# Patient Record
Sex: Female | Born: 1965 | Race: White | Hispanic: No | Marital: Married | Smoking: Never smoker
Health system: Southern US, Community
[De-identification: ages and names within clinical notes are randomized; demographics above are authoritative.]

## PROBLEM LIST (undated history)

## (undated) DIAGNOSIS — J45909 Unspecified asthma, uncomplicated: Secondary | ICD-10-CM

## (undated) DIAGNOSIS — I4891 Unspecified atrial fibrillation: Secondary | ICD-10-CM

## (undated) DIAGNOSIS — E119 Type 2 diabetes mellitus without complications: Secondary | ICD-10-CM

## (undated) DIAGNOSIS — I509 Heart failure, unspecified: Secondary | ICD-10-CM

## (undated) HISTORY — DX: Unspecified atrial fibrillation: I48.91

## (undated) HISTORY — PX: NASAL SINUS SURGERY: SHX719

## (undated) HISTORY — DX: Heart failure, unspecified: I50.9

## (undated) HISTORY — PX: TONSILLECTOMY: SUR1361

---

## 2021-08-12 ENCOUNTER — Other Ambulatory Visit: Payer: Self-pay

## 2021-08-12 DIAGNOSIS — I5023 Acute on chronic systolic (congestive) heart failure: Secondary | ICD-10-CM | POA: Diagnosis present

## 2021-08-12 DIAGNOSIS — Z6841 Body Mass Index (BMI) 40.0 and over, adult: Secondary | ICD-10-CM

## 2021-08-12 DIAGNOSIS — Z9119 Patient's noncompliance with other medical treatment and regimen: Secondary | ICD-10-CM

## 2021-08-12 DIAGNOSIS — Z20822 Contact with and (suspected) exposure to covid-19: Secondary | ICD-10-CM | POA: Diagnosis present

## 2021-08-12 DIAGNOSIS — Z79899 Other long term (current) drug therapy: Secondary | ICD-10-CM

## 2021-08-12 DIAGNOSIS — J4520 Mild intermittent asthma, uncomplicated: Secondary | ICD-10-CM | POA: Diagnosis present

## 2021-08-12 DIAGNOSIS — E785 Hyperlipidemia, unspecified: Secondary | ICD-10-CM | POA: Diagnosis present

## 2021-08-12 DIAGNOSIS — I252 Old myocardial infarction: Secondary | ICD-10-CM

## 2021-08-12 DIAGNOSIS — I4891 Unspecified atrial fibrillation: Secondary | ICD-10-CM | POA: Diagnosis present

## 2021-08-12 DIAGNOSIS — I248 Other forms of acute ischemic heart disease: Secondary | ICD-10-CM | POA: Diagnosis present

## 2021-08-12 DIAGNOSIS — E1165 Type 2 diabetes mellitus with hyperglycemia: Secondary | ICD-10-CM | POA: Diagnosis present

## 2021-08-12 DIAGNOSIS — I110 Hypertensive heart disease with heart failure: Principal | ICD-10-CM | POA: Diagnosis present

## 2021-08-12 DIAGNOSIS — Z5329 Procedure and treatment not carried out because of patient's decision for other reasons: Secondary | ICD-10-CM | POA: Diagnosis not present

## 2021-08-12 DIAGNOSIS — Z8249 Family history of ischemic heart disease and other diseases of the circulatory system: Secondary | ICD-10-CM

## 2021-08-12 DIAGNOSIS — J329 Chronic sinusitis, unspecified: Secondary | ICD-10-CM | POA: Diagnosis present

## 2021-08-12 NOTE — ED Triage Notes (Signed)
Pt arrived via POV with reports of approx 50lb weight gain over the past 3 weeks, as well as exertional shortness of breath. Pt states she normally has swelling to L leg, pt states she has been treated recently for recurrent sinusitis. Pt has been on doxycycline and clarithromycin.

## 2021-08-13 ENCOUNTER — Inpatient Hospital Stay: Payer: Medicaid Other

## 2021-08-13 ENCOUNTER — Encounter: Payer: Self-pay | Admitting: Emergency Medicine

## 2021-08-13 ENCOUNTER — Inpatient Hospital Stay
Admission: EM | Admit: 2021-08-13 | Discharge: 2021-08-16 | DRG: 291 | Discharge status: Against Medical Advice | Payer: Self-pay | Attending: Internal Medicine | Admitting: Internal Medicine

## 2021-08-13 ENCOUNTER — Emergency Department: Payer: Medicaid Other

## 2021-08-13 ENCOUNTER — Other Ambulatory Visit: Payer: Self-pay

## 2021-08-13 DIAGNOSIS — R7989 Other specified abnormal findings of blood chemistry: Secondary | ICD-10-CM

## 2021-08-13 DIAGNOSIS — I509 Heart failure, unspecified: Secondary | ICD-10-CM

## 2021-08-13 DIAGNOSIS — Z794 Long term (current) use of insulin: Secondary | ICD-10-CM

## 2021-08-13 DIAGNOSIS — I4891 Unspecified atrial fibrillation: Secondary | ICD-10-CM

## 2021-08-13 DIAGNOSIS — J45909 Unspecified asthma, uncomplicated: Secondary | ICD-10-CM

## 2021-08-13 DIAGNOSIS — I5021 Acute systolic (congestive) heart failure: Secondary | ICD-10-CM

## 2021-08-13 DIAGNOSIS — M7989 Other specified soft tissue disorders: Secondary | ICD-10-CM

## 2021-08-13 DIAGNOSIS — R778 Other specified abnormalities of plasma proteins: Secondary | ICD-10-CM

## 2021-08-13 DIAGNOSIS — Z6841 Body Mass Index (BMI) 40.0 and over, adult: Secondary | ICD-10-CM

## 2021-08-13 DIAGNOSIS — E1165 Type 2 diabetes mellitus with hyperglycemia: Secondary | ICD-10-CM

## 2021-08-13 DIAGNOSIS — J329 Chronic sinusitis, unspecified: Secondary | ICD-10-CM

## 2021-08-13 HISTORY — DX: Type 2 diabetes mellitus without complications: E11.9

## 2021-08-13 HISTORY — DX: Unspecified asthma, uncomplicated: J45.909

## 2021-08-13 LAB — TROPONIN I (HIGH SENSITIVITY)
Troponin I (High Sensitivity): 75 ng/L — ABNORMAL HIGH (ref <18)
Troponin I (High Sensitivity): 73 ng/L — ABNORMAL HIGH (ref <18)

## 2021-08-13 LAB — CBC WITH DIFFERENTIAL/PLATELET
Abs Immature Granulocytes: 0.05 10*3/uL (ref 0.00–0.07)
Basophils Absolute: 0.1 10*3/uL (ref 0.0–0.1)
Basophils Relative: 1 %
Eosinophils Absolute: 0.3 10*3/uL (ref 0.0–0.5)
Eosinophils Relative: 3 %
HCT: 46.6 % — ABNORMAL HIGH (ref 36.0–46.0)
Hemoglobin: 15.8 g/dL — ABNORMAL HIGH (ref 12.0–15.0)
Immature Granulocytes: 1 %
Lymphocytes Relative: 21 %
Lymphs Abs: 2 10*3/uL (ref 0.7–4.0)
MCH: 31 pg (ref 26.0–34.0)
MCHC: 33.9 g/dL (ref 30.0–36.0)
MCV: 91.4 fL (ref 80.0–100.0)
Monocytes Absolute: 0.8 10*3/uL (ref 0.1–1.0)
Monocytes Relative: 8 %
Neutro Abs: 6.5 10*3/uL (ref 1.7–7.7)
Neutrophils Relative %: 66 %
Platelets: 294 10*3/uL (ref 150–400)
RBC: 5.1 MIL/uL (ref 3.87–5.11)
RDW: 15.6 % — ABNORMAL HIGH (ref 11.5–15.5)
WBC: 9.8 10*3/uL (ref 4.0–10.5)
nRBC: 0 % (ref 0.0–0.2)

## 2021-08-13 LAB — BASIC METABOLIC PANEL
Anion gap: 9 (ref 5–15)
BUN: 29 mg/dL — ABNORMAL HIGH (ref 6–20)
CO2: 23 mmol/L (ref 22–32)
Calcium: 8.4 mg/dL — ABNORMAL LOW (ref 8.9–10.3)
Chloride: 99 mmol/L (ref 98–111)
Creatinine, Ser: 1.12 mg/dL — ABNORMAL HIGH (ref 0.44–1.00)
GFR, Estimated: 59 mL/min — ABNORMAL LOW (ref >60)
Glucose, Bld: 320 mg/dL — ABNORMAL HIGH (ref 70–99)
Potassium: 3.8 mmol/L (ref 3.5–5.1)
Sodium: 131 mmol/L — ABNORMAL LOW (ref 135–145)

## 2021-08-13 LAB — RESP PANEL BY RT-PCR (FLU A&B, COVID) ARPGX2
Influenza A by PCR: NEGATIVE
Influenza B by PCR: NEGATIVE
SARS Coronavirus 2 by RT PCR: NEGATIVE

## 2021-08-13 LAB — CBG MONITORING, ED
Glucose-Capillary: 190 mg/dL — ABNORMAL HIGH (ref 70–99)
Glucose-Capillary: 110 mg/dL — ABNORMAL HIGH (ref 70–99)
Glucose-Capillary: 300 mg/dL — ABNORMAL HIGH (ref 70–99)
Glucose-Capillary: 388 mg/dL — ABNORMAL HIGH (ref 70–99)

## 2021-08-13 LAB — MAGNESIUM: Magnesium: 2.4 mg/dL (ref 1.7–2.4)

## 2021-08-13 LAB — HEMOGLOBIN A1C
Hgb A1c MFr Bld: 9.2 % — ABNORMAL HIGH (ref 4.8–5.6)
Mean Plasma Glucose: 217.34 mg/dL

## 2021-08-13 LAB — HIV ANTIBODY (ROUTINE TESTING W REFLEX): HIV Screen 4th Generation wRfx: NONREACTIVE

## 2021-08-13 LAB — BRAIN NATRIURETIC PEPTIDE: B Natriuretic Peptide: 212.2 pg/mL — ABNORMAL HIGH (ref 0.0–100.0)

## 2021-08-13 MED ORDER — ACETAMINOPHEN 325 MG PO TABS
650.0000 mg | ORAL_TABLET | Freq: Four times a day (QID) | ORAL | Status: DC | PRN
  Administered 2021-08-14 – 2021-08-15 (×3): 650 mg via ORAL
  Filled 2021-08-13 (×4): qty 2

## 2021-08-13 MED ORDER — FLUTICASONE PROPIONATE 50 MCG/ACT NA SUSP
2.0000 | Freq: Every day | NASAL | Status: DC
  Filled 2021-08-13: qty 16

## 2021-08-13 MED ORDER — CLARITHROMYCIN 500 MG PO TABS: 500.0000 mg | ORAL_TABLET | Freq: Two times a day (BID) | ORAL | Status: DC

## 2021-08-13 MED ORDER — INSULIN ASPART 100 UNIT/ML IJ SOLN
0.0000 [IU] | Freq: Every day | INTRAMUSCULAR | Status: DC
Start: 2021-08-13 — End: 2021-08-14
  Administered 2021-08-13: 5 [IU] via SUBCUTANEOUS
  Filled 2021-08-13: qty 1

## 2021-08-13 MED ORDER — ENOXAPARIN SODIUM 100 MG/ML IJ SOSY
0.5000 mg/kg | PREFILLED_SYRINGE | INTRAMUSCULAR | Status: DC
  Filled 2021-08-13 (×5): qty 0.9

## 2021-08-13 MED ORDER — CLARITHROMYCIN 500 MG PO TABS
500.0000 mg | ORAL_TABLET | Freq: Two times a day (BID) | ORAL | Status: DC
  Administered 2021-08-13: 500 mg via ORAL
  Filled 2021-08-13 (×2): qty 1

## 2021-08-13 MED ORDER — ATORVASTATIN CALCIUM 20 MG PO TABS
20.0000 mg | ORAL_TABLET | Freq: Every day | ORAL | Status: DC
  Filled 2021-08-13 (×3): qty 1

## 2021-08-13 MED ORDER — METOPROLOL TARTRATE 25 MG PO TABS
25.0000 mg | ORAL_TABLET | Freq: Two times a day (BID) | ORAL | Status: DC
  Administered 2021-08-13 – 2021-08-15 (×2): 25 mg via ORAL
  Filled 2021-08-13 (×4): qty 1

## 2021-08-13 MED ORDER — DILTIAZEM HCL-DEXTROSE 125-5 MG/125ML-% IV SOLN (PREMIX)
5.0000 mg/h | INTRAVENOUS | Status: DC
  Administered 2021-08-13 (×2): 10 mg/h via INTRAVENOUS
  Administered 2021-08-14: 12.5 mg/h via INTRAVENOUS
  Administered 2021-08-14: 10 mg/h via INTRAVENOUS
  Administered 2021-08-14 – 2021-08-15 (×2): 12.5 mg/h via INTRAVENOUS
  Filled 2021-08-13 (×5): qty 125

## 2021-08-13 MED ORDER — ONDANSETRON HCL 4 MG PO TABS
4.0000 mg | ORAL_TABLET | Freq: Four times a day (QID) | ORAL | Status: DC | PRN
  Filled 2021-08-13: qty 1

## 2021-08-13 MED ORDER — INSULIN GLARGINE-YFGN 100 UNIT/ML ~~LOC~~ SOLN
20.0000 [IU] | Freq: Every day | SUBCUTANEOUS | Status: DC
  Filled 2021-08-13 (×2): qty 0.2

## 2021-08-13 MED ORDER — LOSARTAN POTASSIUM 50 MG PO TABS
50.0000 mg | ORAL_TABLET | Freq: Every day | ORAL | Status: DC
  Administered 2021-08-13 – 2021-08-14 (×2): 50 mg via ORAL
  Filled 2021-08-13 (×2): qty 1

## 2021-08-13 MED ORDER — DILTIAZEM HCL 25 MG/5ML IV SOLN
INTRAVENOUS | Status: AC
  Administered 2021-08-13: 10 mg
  Filled 2021-08-13: qty 5

## 2021-08-13 MED ORDER — ACETAMINOPHEN 650 MG RE SUPP: 650.0000 mg | Freq: Four times a day (QID) | RECTAL | Status: DC | PRN

## 2021-08-13 MED ORDER — DILTIAZEM LOAD VIA INFUSION: 10.0000 mg | Freq: Once | INTRAVENOUS | Status: DC

## 2021-08-13 MED ORDER — ONDANSETRON HCL 4 MG/2ML IJ SOLN: 4.0000 mg | Freq: Four times a day (QID) | INTRAMUSCULAR | Status: DC | PRN

## 2021-08-13 MED ORDER — FUROSEMIDE 10 MG/ML IJ SOLN
40.0000 mg | Freq: Two times a day (BID) | INTRAMUSCULAR | Status: DC
  Administered 2021-08-13 – 2021-08-14 (×3): 40 mg via INTRAVENOUS
  Filled 2021-08-13 (×3): qty 4

## 2021-08-13 MED ORDER — LEVALBUTEROL HCL 0.63 MG/3ML IN NEBU: 0.6300 mg | INHALATION_SOLUTION | Freq: Four times a day (QID) | RESPIRATORY_TRACT | Status: DC | PRN

## 2021-08-13 MED ORDER — DILTIAZEM HCL-DEXTROSE 125-5 MG/125ML-% IV SOLN (PREMIX)
5.0000 mg/h | INTRAVENOUS | Status: DC
  Administered 2021-08-13: 5 mg/h via INTRAVENOUS
  Filled 2021-08-13: qty 125

## 2021-08-13 MED ORDER — SALINE SPRAY 0.65 % NA SOLN
5.0000 | Freq: Two times a day (BID) | NASAL | Status: DC | PRN
  Filled 2021-08-13: qty 44

## 2021-08-13 MED ORDER — INSULIN ASPART 100 UNIT/ML IJ SOLN
0.0000 [IU] | Freq: Three times a day (TID) | INTRAMUSCULAR | Status: DC
  Administered 2021-08-13: 11 [IU] via SUBCUTANEOUS
  Filled 2021-08-13: qty 1

## 2021-08-13 NOTE — ED Notes (Signed)
Pt assisted to bathroom with steady gait

## 2021-08-13 NOTE — ED Notes (Addendum)
Pt switched over to hospital bed, lights dimmed, additional blankets provided, refused purewick, husband going home, no other needs at this time

## 2021-08-13 NOTE — H&P (Signed)
History and Physical    Donna Koch FIE:[id number] DOB: 1966/08/02 DOA: 08/13/2021  PCP: Pcp, No   Patient coming from: home  I have personally briefly reviewed patient's old medical records in [facility]  Chief Complaint: shortness of breath  HPI: Donna Koch is a 55 y.o. female with medical history significant for Diabetes, asthma who presents to the ED with a several day history of shortness of breath with exertion as well as lower extremity edema and weight gain.  She denies chest pain.  Has no cough fever or chills.  Denies orthopnea or PND.  Has no one-sided lower extremity pain or swelling.  Denies palpitations or lightheadedness.  Has a remote history of CHF but does not currently on diuretics.  Is currently on antibiotics for sinusitis.  ED course: On arrival afebrile with BP 121/67 and heart rate in the 150s, O2 sat 96% on room air Blood work: Troponin 75 and BNP 212.  Mild creatinine elevation of 1.12 with no recent creatinine on record.  Blood sugar 320.  WBC normal at 9.8.  Hemoglobin 15.  COVID and flu negative  EKG, personally viewed and interpreted: Atrial fibrillation at 163 with no acute ST-T wave changes.  EKG post diltiazem bolus A. fib at 117  Imaging: Chest x-ray with cardiac enlargement no pulmonary vascular congestion or consolidation  Patient started on diltiazem infusion.  Given IV Lasix.  Hospitalist consulted for admission.  Review of Systems: As per HPI otherwise all other systems on review of systems negative.    Past Medical History:  Diagnosis Date   Asthma    Diabetes mellitus without complication (HCC)     Past Surgical History:  Procedure Laterality Date   CESAREAN SECTION     NASAL SINUS SURGERY     TONSILLECTOMY       reports that she has never smoked. She has never used smokeless tobacco. She reports that she does not currently use alcohol. No history on file for drug use.  Allergies  Allergen Reactions   Aspirin Shortness Of  Breath    Pt with asthma, aspirin allergy, nasal polyps   Cephalosporins Rash    Avoid because of reaction to penicillins    Nsaids Anaphylaxis   Penicillins Anaphylaxis   Ace Inhibitors     Other reaction(s): Cough, Other (See Comments) Other Reaction: WHEEZE   Levofloxacin Nausea Only   Oxycodone     Other reaction(s): Other (See Comments) Other Reaction: N/V/DIZZY/WHEEZE    History reviewed. No pertinent family history.    Prior to Admission medications   Medication Sig Start Date End Date Taking? Authorizing Provider  albuterol (VENTOLIN HFA) 108 (90 Base) MCG/ACT inhaler Inhale 1-2 puffs into the lungs every 6 (six) hours as needed. 04/24/18  Yes [provider]  calcium-vitamin D (OSCAL WITH D) 500-200 MG-UNIT tablet Take 1 tablet by mouth daily.   Yes [provider]  Cetirizine HCl 10 MG CAPS Take 1 tablet by mouth daily.   Yes [provider]  clarithromycin (BIAXIN) 500 MG tablet Take 500 mg by mouth in the morning and at bedtime. For 10 days 08/07/21 08/17/21 Yes [provider]  diphenhydrAMINE (BENADRYL) 25 mg capsule Take 50 mg by mouth at bedtime as needed.   Yes [provider]  furosemide (LASIX) 20 MG tablet Take 20 mg by mouth daily. 05/05/21  Yes [provider]  insulin NPH Human (NOVOLIN N) 100 UNIT/ML injection Inject 55 Units into the skin in the morning. 10/07/19  Yes [provider]  Insulin NPH Isophane & Regular (NOVOLIN 70/30 [place]) Inject 45-75 Units into the skin in the morning.   Yes [provider]  insulin regular (NOVOLIN R) 100 units/mL injection Inject 65 Units into the skin in the morning and at bedtime. 04/24/18  Yes [provider]  losartan (COZAAR) 50 MG tablet Take 50 mg by mouth daily. 05/05/21  Yes [provider]  Multiple Vitamin (MULTIVITAMINS PO) Take 1 tablet by mouth daily. 01/07/07  Yes [provider]    Physical Exam: Vitals:   08/13/21 0024  08/13/21 0034 08/13/21 0113 08/13/21 0137  BP:  103/90 (!) 119/92 (!) 115/91  Pulse: (!) 174 (!) 111 (!) 121 (!) 125  Resp:  (!) 24 (!) 22 19  Temp:      TempSrc:      SpO2:  97% 100% 99%  Weight:      Height:         Vitals:   08/13/21 0024 08/13/21 0034 08/13/21 0113 08/13/21 0137  BP:  103/90 (!) 119/92 (!) 115/91  Pulse: (!) 174 (!) 111 (!) 121 (!) 125  Resp:  (!) 24 (!) 22 19  Temp:      TempSrc:      SpO2:  97% 100% 99%  Weight:      Height:          Constitutional: Alert and oriented x 3 . Not in any apparent distress HEENT:      Head: Normocephalic and atraumatic.         Eyes: PERLA, EOMI, Conjunctivae are normal. Sclera is non-icteric.       Mouth/Throat: Mucous membranes are moist.       Neck: Supple with no signs of meningismus. Cardiovascular: Irregular, tachycardic. No murmurs, gallops, or rubs. 2+ symmetrical distal pulses are present . No JVD.  2+ pitting LE edema Respiratory: Respiratory effort normal .Lungs sounds clear bilaterally. No wheezes, crackles, or rhonchi.  Gastrointestinal: Soft, non tender, and non distended with positive bowel sounds.  Genitourinary: No CVA tenderness. Musculoskeletal: Nontender with normal range of motion in all extremities. No cyanosis, or erythema of extremities. Neurologic:  Face is symmetric. Moving all extremities. No gross focal neurologic deficits . Skin: Skin is warm, dry.  No rash or ulcers Psychiatric: Mood and affect are normal    Labs on Admission: I have personally reviewed following labs and imaging studies  CBC: Recent Labs  Lab 08/13/21 0032  WBC 9.8  NEUTROABS 6.5  HGB 15.8*  HCT 46.6*  MCV 91.4  PLT 294   Basic Metabolic Panel: Recent Labs  Lab 08/13/21 0032  NA 131*  K 3.8  CL 99  CO2 23  GLUCOSE 320*  BUN 29*  CREATININE 1.12*  CALCIUM 8.4*  MG 2.4   GFR: Estimated Creatinine Clearance: 91.2 mL/min (A) (by C-G formula based on SCr of 1.12 mg/dL (H)). Liver Function Tests: No  results for input(s): AST, ALT, ALKPHOS, BILITOT, PROT, ALBUMIN in the last 168 hours. No results for input(s): LIPASE, AMYLASE in the last 168 hours. No results for input(s): AMMONIA in the last 168 hours. Coagulation Profile: No results for input(s): INR, PROTIME in the last 168 hours. Cardiac Enzymes: No results for input(s): CKTOTAL, CKMB, CKMBINDEX, TROPONINI in the last 168 hours. BNP (last 3 results) No results for input(s): PROBNP in the last 8760 hours. HbA1C: No results for input(s): HGBA1C in the last 72 hours. CBG: No results for input(s): GLUCAP in the last 168  hours. Lipid Profile: No results for input(s): CHOL, HDL, LDLCALC, TRIG, CHOLHDL, LDLDIRECT in the last 72 hours. Thyroid Function Tests: No results for input(s): TSH, T4TOTAL, FREET4, T3FREE, THYROIDAB in the last 72 hours. Anemia Panel: No results for input(s): VITAMINB12, FOLATE, FERRITIN, TIBC, IRON, RETICCTPCT in the last 72 hours. Urine analysis: No results found for: COLORURINE, APPEARANCEUR, LABSPEC, PHURINE, GLUCOSEU, HGBUR, BILIRUBINUR, KETONESUR, PROTEINUR, UROBILINOGEN, NITRITE, LEUKOCYTESUR  Radiological Exams on Admission: DG Chest Portable 1 View  Result Date: 08/13/2021 CLINICAL DATA:  Shortness of breath with exertion EXAM: PORTABLE CHEST 1 VIEW COMPARISON:  02/21/2017 FINDINGS: Cardiac enlargement. No edema or consolidation. No pleural effusions. No pneumothorax. Mediastinal contours appear intact. IMPRESSION: Cardiac enlargement.  No active pulmonary disease. Electronically Signed   By: Burman Nieves M.D.   On: 08/13/2021 00:51     Assessment/Plan 55 year old female with history of diabetes, and asthma presenting with a several day history of shortness of breath associated with weight gain.      Atrial fibrillation with RVR, new onset (HCC) -EKG showing A. fib with rate in the 160s - Continue diltiazem infusion to transition to oral when appropriate - CHA2DS2-VASc of 4 so will benefit from  systemic anticoagulation for stroke prevention - Will defer initiation of DOAC to pending further discussion cardiology consultation - Cardiology consult    Acute CHF (congestive heart failure) (HCC) - Patient with shortness of breath and weight gain, BNP 212, cardiomegaly without pulmonary vascular congestion on chest x-ray -Chart review in Care Everywhere reveals echocardiogram 2002 with a EF 40 to 45% with severely hypokinetic distal septum.  Card cath was negative. - IV Lasix - Continue home ACE/ARB - Daily weights with intake and output monitoring - Echocardiogram - Cardiology consult    Elevated troponin - Troponin elevated at 75 but EKG without acute ST-T wave changes and patient denies chest pain - Likely secondary to demand ischemia from rapid A. fib - Continue to trend - Follow echo to evaluate for focal wall motion abnormality    Hyperglycemia due to type 2 diabetes mellitus (HCC) - Blood sugar 320 - Basal insulin with sliding scale insulin coverage - Follow A1c    Morbid obesity with BMI of 70 and over, adult (HCC) - Complicating factor to overall prognosis and care    Sinusitis - Continue outpatient clarithromycin to be completed on 7/22    Asthma - Albuterol as needed    DVT prophylaxis: Lovenox  Code Status: full code  Family Communication:  none  Disposition Plan: Back to previous home environment Consults called: Cardiology Status:At the time of admission, it appears that the appropriate admission status for this patient is INPATIENT. This is judged to be reasonable and necessary in order to provide the required intensity of service to ensure the patient's safety given the presenting symptoms, physical exam findings, and initial radiographic and laboratory data in the context of their  Comorbid conditions.   Patient requires inpatient status due to high intensity of service, high risk for further deterioration and high frequency of surveillance required.   I  certify that at the point of admission it is my clinical judgment that the patient will require inpatient hospital care spanning beyond 2 midnights     Andris Baumann MD [facility]     08/13/2021, 2:08 AM

## 2021-08-13 NOTE — ED Notes (Signed)
Assisted to room bathroom

## 2021-08-13 NOTE — Progress Notes (Signed)
PROGRESS NOTE  Donna Koch IEP:[id number] DOB: 06/01/1966   PCP: Pcp, No  Patient is from: Home.  Lives with family.  Independently ambulates at baseline.  DOA: 08/13/2021 LOS: 0  Chief complaints:  Chief Complaint  Patient presents with   Shortness of Breath     Brief Narrative / Interim history: 55 year old F with PMH of DM-2, systolic CHF, morbid obesity, asthma, GIB and "recurrent sinusitis" presenting with shortness of breath, lower extremity edema and increased weight gain, and admitted for A. fib with RVR and systolic CHF exacerbation.  Started on IV Lasix and Cardizem drip.  Cardiology consulted.  Subjective: Seen and examined earlier this morning.  No major events overnight of this morning.  She says she feels better from breathing standpoint.  She thinks her legs are still swollen.  Reports good compliance with her Lasix and sodium restriction but reports drinking a lot of water.  Denies NSAID use.  She refuses anticoagulation despite his significant risk for CVA.  She says she had history of GI bleed on Plavix when she had coronary artery spasm.   Objective: Vitals:   08/13/21 0845 08/13/21 0900 08/13/21 1130 08/13/21 1300  BP: (!) 129/97   132/89  Pulse: 95 (!) 110 (!) 113 72  Resp: 20  (!) 21 (!) 21  Temp:      TempSrc:      SpO2: 96% 94% 98% 99%  Weight:      Height:       No intake or output data in the 24 hours ending 08/13/21 1348 Filed Weights   08/12/21 2347  Weight: (!) 180.5 kg    Examination:  GENERAL: No apparent distress.  Nontoxic. HEENT: MMM.  Vision and hearing grossly intact.  NECK: Supple.  Not able to assess JVD due to body habitus. RESP: IR at 90-100 bpm.  No IWOB.  Fair aeration but limited exam due to body habitus. CVS:  RRR. Heart sounds normal.  ABD/GI/GU: BS+. Abd soft, NTND.  MSK/EXT:  Moves extremities. No apparent deformity. No edema.  SKIN: no apparent skin lesion or wound NEURO: Awake, alert and oriented appropriately.  No  apparent focal neuro deficit. PSYCH: Calm. Normal affect.   Procedures:  None  Microbiology summarized: COVID-19 and influenza PCR nonreactive.  Assessment & Plan: New onset atrial fibrillation with RVR: EKG showing A. fib with rate in the 160s.  HR improved.  CHA2DS2-VASc score 4 for CHF, DM, HTN and sex.  Patient refuses anticoagulation despite risk and benefit discussion stating previous history of GI bleed on Plavix when she was on Plavix for coronary spasm. -Continue Cardizem drip.  Will transition when able -Add p.o. metoprolol 25 mg twice daily -Dr. Sharrell Ku from cardiology consulted on admission.  I have also sent secure chat -Follow TTE  Acute on chronic systolic CHF: TTE in 2002 with EF of 40 to 45%, severely hypokinetic distal septum.  Cardiac cath at that time negative.  Reports taking p.o. Lasix 20 mg daily.  Admits to fluid indiscretion. BNP 212 which could be falsely low given morbid obesity.  CXR consistent with cardiomegaly.  Started on IV Lasix.  I&O incomplete. -Continue IV Lasix -Monitor fluid status, renal functions and electrolytes -Requested RN to monitor I&O closely -Sodium and fluid restriction -Follow TTE -Further plan per cardiology.    Elevated troponin-likely demand ischemia from above.  Troponin 75>> 72.  No significant delta.  EKG without acute ischemic finding. -Management as above -Follow TTE   Uncontrolled DM-2 with hyperglycemia: A1c  9.2%.  On NPH 55 units daily and 65 units twice daily Recent Labs  Lab 08/13/21 0305 08/13/21 0847  GLUCAP 190* 110*  -Continue SSI-resistant and Semglee 20 units nightly -Start statin. -Could benefit from [place] or GLP-1 agonists  Essential hypertension: BP within fair range.  On losartan at home. -Continue home losartan -Added metoprolol -Also on Cardizem drip for A. fib   "Recurrent sinusitis"-reportedly treated with doxycycline without improvement and transition to clarithromycin.  No clinical signs  of bacterial sinusitis on exam.  She might have viral or allergy. -Discussed about saline nose spray and Flonase -Discontinue Biaxin  Mild intermittent asthma: Only uses as needed albuterol -Use Xopenex as needed in the setting of A. fib  Morbid obesity Body mass index is 77.73 kg/m.         DVT prophylaxis:  On subcu Lovenox for VTE prophylaxis  Code Status: Full code Family Communication: Patient and/or RN. Available if any question.  Level of care: Progressive Cardiac Status is: Inpatient  Remains inpatient appropriate because:Hemodynamically unstable, Ongoing diagnostic testing needed not appropriate for outpatient work up, IV treatments appropriate due to intensity of illness or inability to take PO, and Inpatient level of care appropriate due to severity of illness  Dispo: The patient is from: Home              Anticipated d/c is to: Home              Patient currently is not medically stable to d/c.   Difficult to place patient No       Consultants:  Cardiology   Sch Meds:  Scheduled Meds:  atorvastatin  20 mg Oral Daily   enoxaparin (LOVENOX) injection  0.5 mg/kg Subcutaneous Q24H   fluticasone  2 spray Each Nare Daily   furosemide  40 mg Intravenous Q12H   insulin aspart  0-20 Units Subcutaneous TID WC   insulin aspart  0-5 Units Subcutaneous QHS   insulin glargine-yfgn  20 Units Subcutaneous QHS   losartan  50 mg Oral Daily   metoprolol tartrate  25 mg Oral BID   Continuous Infusions:  diltiazem (CARDIZEM) infusion 10 mg/hr (08/13/21 1227)   PRN Meds:.acetaminophen **OR** acetaminophen, ondansetron **OR** ondansetron (ZOFRAN) IV, sodium chloride  Antimicrobials: Anti-infectives (From admission, onward)    Start     Dose/Rate Route Frequency Ordered Stop   08/13/21 1000  clarithromycin (BIAXIN) tablet 500 mg  Status:  Discontinued       Note to Pharmacy: For 10 days     500 mg Oral Every 12 hours 08/13/21 0712 08/13/21 1324   08/13/21 0200   clarithromycin (BIAXIN) tablet 500 mg  Status:  Discontinued       Note to Pharmacy: For 10 days     500 mg Oral Every 12 hours 08/13/21 0153 08/13/21 0711        I have personally reviewed the following labs and images: CBC: Recent Labs  Lab 08/13/21 0032  WBC 9.8  NEUTROABS 6.5  HGB 15.8*  HCT 46.6*  MCV 91.4  PLT 294   BMP &GFR Recent Labs  Lab 08/13/21 0032  NA 131*  K 3.8  CL 99  CO2 23  GLUCOSE 320*  BUN 29*  CREATININE 1.12*  CALCIUM 8.4*  MG 2.4   Estimated Creatinine Clearance: 91.2 mL/min (A) (by C-G formula based on SCr of 1.12 mg/dL (H)). Liver & Pancreas: No results for input(s): AST, ALT, ALKPHOS, BILITOT, PROT, ALBUMIN in the last 168 hours. No  results for input(s): LIPASE, AMYLASE in the last 168 hours. No results for input(s): AMMONIA in the last 168 hours. Diabetic: Recent Labs    08/13/21 0310  HGBA1C 9.2*   Recent Labs  Lab 08/13/21 0305 08/13/21 0847  GLUCAP 190* 110*   Cardiac Enzymes: No results for input(s): CKTOTAL, CKMB, CKMBINDEX, TROPONINI in the last 168 hours. No results for input(s): PROBNP in the last 8760 hours. Coagulation Profile: No results for input(s): INR, PROTIME in the last 168 hours. Thyroid Function Tests: No results for input(s): TSH, T4TOTAL, FREET4, T3FREE, THYROIDAB in the last 72 hours. Lipid Profile: No results for input(s): CHOL, HDL, LDLCALC, TRIG, CHOLHDL, LDLDIRECT in the last 72 hours. Anemia Panel: No results for input(s): VITAMINB12, FOLATE, FERRITIN, TIBC, IRON, RETICCTPCT in the last 72 hours. Urine analysis: No results found for: COLORURINE, APPEARANCEUR, LABSPEC, PHURINE, GLUCOSEU, HGBUR, BILIRUBINUR, KETONESUR, PROTEINUR, UROBILINOGEN, NITRITE, LEUKOCYTESUR Sepsis Labs: Invalid input(s): PROCALCITONIN, LACTICIDVEN  Microbiology: Recent Results (from the past 240 hour(s))  Resp Panel by RT-PCR (Flu A&B, Covid) Nasopharyngeal Swab     Status: None   Collection Time: 08/13/21 12:32 AM    Specimen: Nasopharyngeal Swab; Nasopharyngeal(NP) swabs in vial transport medium  Result Value Ref Range Status   SARS Coronavirus 2 by RT PCR NEGATIVE NEGATIVE Final    Comment: (NOTE) SARS-CoV-2 target nucleic acids are NOT DETECTED.  The SARS-CoV-2 RNA is generally detectable in upper respiratory specimens during the acute phase of infection. The lowest concentration of SARS-CoV-2 viral copies this assay can detect is 138 copies/mL. A negative result does not preclude SARS-Cov-2 infection and should not be used as the sole basis for treatment or other patient management decisions. A negative result may occur with  improper specimen collection/handling, submission of specimen other than nasopharyngeal swab, presence of viral mutation(s) within the areas targeted by this assay, and inadequate number of viral copies(<138 copies/mL). A negative result must be combined with clinical observations, patient history, and epidemiological information. The expected result is Negative.  Fact Sheet for Patients:  BloggerCourse.com  Fact Sheet for Healthcare Providers:  SeriousBroker.it  This test is no t yet approved or cleared by the [place] FDA and  has been authorized for detection and/or diagnosis of SARS-CoV-2 by FDA under an Emergency Use Authorization (EUA). This EUA will remain  in effect (meaning this test can be used) for the duration of the COVID-19 declaration under Section 564(b)(1) of the Act, 21 U.S.C.section 360bbb-3(b)(1), unless the authorization is terminated  or revoked sooner.       Influenza A by PCR NEGATIVE NEGATIVE Final   Influenza B by PCR NEGATIVE NEGATIVE Final    Comment: (NOTE) The Xpert Xpress SARS-CoV-2/FLU/RSV plus assay is intended as an aid in the diagnosis of influenza from Nasopharyngeal swab specimens and should not be used as a sole basis for treatment. Nasal washings and aspirates are  unacceptable for Xpert Xpress SARS-CoV-2/FLU/RSV testing.  Fact Sheet for Patients: BloggerCourse.com  Fact Sheet for Healthcare Providers: SeriousBroker.it  This test is not yet approved or cleared by the [place] FDA and has been authorized for detection and/or diagnosis of SARS-CoV-2 by FDA under an Emergency Use Authorization (EUA). This EUA will remain in effect (meaning this test can be used) for the duration of the COVID-19 declaration under Section 564(b)(1) of the Act, 21 U.S.C. section 360bbb-3(b)(1), unless the authorization is terminated or revoked.  Performed at [facility], [address], [place], [place] [address]     Radiology Studies: [place]  Chest Portable 1 View  Result Date: 08/13/2021 CLINICAL DATA:  Shortness of breath with exertion EXAM: PORTABLE CHEST 1 VIEW COMPARISON:  02/21/2017 FINDINGS: Cardiac enlargement. No edema or consolidation. No pleural effusions. No pneumothorax. Mediastinal contours appear intact. IMPRESSION: Cardiac enlargement.  No active pulmonary disease. Electronically Signed   By: Burman Nieves M.D.   On: 08/13/2021 00:51      Donna Koch T. Eshika Reckart [facility]  If 7PM-7AM, please contact night-coverage [email or web address] 08/13/2021, 1:48 PM

## 2021-08-13 NOTE — ED Provider Notes (Signed)
[facility] Emergency Department Provider Note  ____________________________________________   Event Date/Time   First MD Initiated Contact with Patient 08/13/21 0008     (approximate)  I have reviewed the triage vital signs and the nursing notes.   HISTORY  Chief Complaint Shortness of Breath    HPI Donna Koch is a 55 y.o. female with history of asthma, diabetes who presents to the emergency department with complaints of shortness of breath with exertion.  She denies any chest pain or chest discomfort.  States she has no shortness of breath at rest.  She reports she has gained 50 pounds in the past 3 weeks.  States she has had CHF once before.  She states she has not on any diuretics.  States for the past couple of weeks she has had ear pressure and reports history of chronic and recurrent sinusitis.  She has been on doxycycline without any relief.  States she was just switched to clarithromycin.  She denies any fevers.  Patient noted to be in atrial fibrillation with RVR.  She denies any history of arrhythmia.  She is not on anticoagulation.        Past Medical History:  Diagnosis Date   Asthma    Diabetes mellitus without complication [facility])     Patient Active Problem List   Diagnosis Date Noted   Hyperglycemia due to type 2 diabetes mellitus (HCC) 08/13/2021   Atrial fibrillation with RVR, new onset (HCC) 08/13/2021   Acute CHF (congestive heart failure) (HCC) 08/13/2021   Morbid obesity with BMI of 70 and over, adult (HCC) 08/13/2021   Sinusitis 08/13/2021   Elevated troponin 08/13/2021   Rapid atrial fibrillation (HCC) 08/13/2021     Past Surgical History:  Procedure Laterality Date   CESAREAN SECTION     NASAL SINUS SURGERY     TONSILLECTOMY      Prior to Admission medications   Medication Sig Start Date End Date Taking? Authorizing Provider  albuterol (VENTOLIN HFA) 108 (90 Base) MCG/ACT inhaler Inhale 1-2 puffs into the lungs every  6 (six) hours as needed. 04/24/18  Yes [provider]  calcium-vitamin D (OSCAL WITH D) 500-200 MG-UNIT tablet Take 1 tablet by mouth daily.   Yes [provider]  Cetirizine HCl 10 MG CAPS Take 1 tablet by mouth daily.   Yes [provider]  clarithromycin (BIAXIN) 500 MG tablet Take 500 mg by mouth in the morning and at bedtime. For 10 days 08/07/21 08/17/21 Yes [provider]  diphenhydrAMINE (BENADRYL) 25 mg capsule Take 50 mg by mouth at bedtime as needed.   Yes [provider]  furosemide (LASIX) 20 MG tablet Take 20 mg by mouth daily. 05/05/21  Yes [provider]  insulin NPH Human (NOVOLIN N) 100 UNIT/ML injection Inject 55 Units into the skin in the morning. 10/07/19  Yes [provider]  Insulin NPH Isophane & Regular (NOVOLIN 70/30 [place]) Inject 45-75 Units into the skin in the morning.   Yes [provider]  insulin regular (NOVOLIN R) 100 units/mL injection Inject 65 Units into the skin in the morning and at bedtime. 04/24/18  Yes [provider]  losartan (COZAAR) 50 MG tablet Take 50 mg by mouth daily. 05/05/21  Yes [provider]  Multiple Vitamin (MULTIVITAMINS PO) Take 1 tablet by mouth daily. 01/07/07  Yes [provider]    Allergies Aspirin, Cephalosporins, Nsaids, Penicillins, Ace inhibitors, Levofloxacin, and Oxycodone  No family history on file.  Social History Social History   Tobacco Use   Smoking status: Never   Smokeless tobacco: Never  Vaping Use   Vaping Use: Never used  Substance Use Topics   Alcohol use: Not Currently    Review of Systems Constitutional: No fever. Eyes: No visual changes. ENT: No sore throat. Cardiovascular: Denies chest pain. Respiratory: + shortness of breath. Gastrointestinal: No nausea, vomiting, diarrhea. Genitourinary: Negative for dysuria. Musculoskeletal: Negative for back pain. Skin: Negative for rash. Neurological: Negative for  focal weakness or numbness.  ____________________________________________   PHYSICAL EXAM:  VITAL SIGNS: ED Triage Vitals  Enc Vitals Group     BP 08/12/21 2348 121/67     Pulse Rate 08/12/21 2348 87     Resp 08/12/21 2348 (!) 25     Temp 08/12/21 2348 97.8 F (36.6 C)     Temp Source 08/12/21 2348 Oral     SpO2 08/12/21 2348 96 %     Weight 08/12/21 2347 (!) 398 lb (180.5 kg)     Height 08/12/21 2347 5' (1.524 m)     Head Circumference --      Peak Flow --      Pain Score 08/13/21 0002 0     Pain Loc --      Pain Edu? --      Excl. in GC? --    CONSTITUTIONAL: Alert and oriented and responds appropriately to questions.  Morbidly obese, chronically ill-appearing HEAD: Normocephalic EYES: Conjunctivae clear, pupils appear equal, EOM appear intact ENT: normal nose; moist mucous membranes; TMs are clear bilaterally without erythema, purulence, bulging, perforation, effusion.  No cerumen impaction or sign of foreign body in the external auditory canal. No inflammation, erythema or drainage from the external auditory canal. No signs of mastoiditis. No pain with manipulation of the pinna bilaterally. NECK: Supple, normal ROM, no JVD appreciated but exam limited due to morbid obesity CARD: Irregularly irregular and tachycardic; S1 and S2 appreciated; no murmurs, no clicks, no rubs, no gallops RESP: Normal chest excursion without splinting or tachypnea; breath sounds clear and equal bilaterally; no wheezes, no rhonchi, no rales, no hypoxia or respiratory distress, speaking full sentences ABD/GI: Normal bowel sounds; non-distended; soft, non-tender, no rebound, no guarding, no peritoneal signs, no hepatosplenomegaly BACK: The back appears normal EXT: Normal ROM in all joints; no deformity noted, +1 pitting edema in bilateral distal lower extremities SKIN: Normal color for age and race; warm; no rash on exposed skin NEURO: Moves all extremities equally PSYCH: The patient's mood and manner  are appropriate.  ____________________________________________   LABS (all labs ordered are listed, but only abnormal results are displayed)  Labs Reviewed  CBC WITH DIFFERENTIAL/PLATELET - Abnormal; Notable for the following components:      Result Value   Hemoglobin 15.8 (*)    HCT 46.6 (*)    RDW 15.6 (*)    All other components within normal limits  BASIC METABOLIC PANEL - Abnormal; Notable for the following components:   Sodium 131 (*)    Glucose, Bld 320 (*)    BUN 29 (*)    Creatinine, Ser 1.12 (*)    Calcium 8.4 (*)    GFR, Estimated 59 (*)    All other components within normal limits  BRAIN NATRIURETIC PEPTIDE - Abnormal; Notable for the following components:   B Natriuretic Peptide 212.2 (*)    All other components within normal limits  TROPONIN I (HIGH SENSITIVITY) - Abnormal; Notable for the following components:   Troponin I (High  Sensitivity) 75 (*)    All other components within normal limits  RESP PANEL BY RT-PCR (FLU A&B, COVID) ARPGX2  MAGNESIUM   ____________________________________________  EKG   Date: 08/12/2021 23:52  Rate: 163  Rhythm: A. fib with RVR  QRS Axis: Left axis deviation  Intervals: Right bundle branch block  ST/T Wave abnormalities: normal  Conduction Disutrbances: none  Narrative Interpretation: Atrial fibrillation with RVR, right bundle branch block   ____________________________________________  RADIOLOGY I, Phyillis Dascoli, personally viewed and evaluated these images (plain radiographs) as part of my medical decision making, as well as reviewing the written report by the radiologist.  ED MD interpretation: No pulmonary edema.  Official radiology report(s): DG Chest Portable 1 View  Result Date: 08/13/2021 CLINICAL DATA:  Shortness of breath with exertion EXAM: PORTABLE CHEST 1 VIEW COMPARISON:  02/21/2017 FINDINGS: Cardiac enlargement. No edema or consolidation. No pleural effusions. No pneumothorax. Mediastinal contours  appear intact. IMPRESSION: Cardiac enlargement.  No active pulmonary disease. Electronically Signed   By: Burman Nieves M.D.   On: 08/13/2021 00:51    ____________________________________________   PROCEDURES  Procedure(s) performed (including Critical Care):  Procedures  CRITICAL CARE Performed by: Baxter Hire Nilda Keathley   Total critical care time: 55 minutes  Critical care time was exclusive of separately billable procedures and treating other patients.  Critical care was necessary to treat or prevent imminent or life-threatening deterioration.  Critical care was time spent personally by me on the following activities: development of treatment plan with patient and/or surrogate as well as nursing, discussions with consultants, evaluation of patient's response to treatment, examination of patient, obtaining history from patient or surrogate, ordering and performing treatments and interventions, ordering and review of laboratory studies, ordering and review of radiographic studies, pulse oximetry and re-evaluation of patient's condition.  ____________________________________________   INITIAL IMPRESSION / ASSESSMENT AND PLAN / ED COURSE  As part of my medical decision making, I reviewed the following data within the electronic MEDICAL RECORD NUMBER Nursing notes reviewed and incorporated, Labs reviewed , EKG interpreted , Old chart reviewed, Radiograph reviewed , Discussed with admitting physician , and Notes from prior ED visits         Patient here with complaints of shortness of breath with exertion.  States symptoms started couple weeks ago when she started having bilateral ear pressure.  Has been on antibiotics without relief.  No fevers.  States she just feels like "crap".  Suspect that this is from her new onset A. fib with RVR and possible CHF.  She appears volume overloaded on exam.  Will obtain cardiac labs, chest x-ray.  We will start diltiazem.  Patient will need admission.  ED  PROGRESS  Patient's heart rate slowly improving.  Hemoglobin normal.  Normal electrolytes.  Troponin mildly elevated which may be rate related.  Unable to give aspirin due to allergy.  BNP 212.  Chest x-ray shows cardiac enlargement but no other acute abnormality.  Will discuss with hospitalist for admission.  1:44 AM Discussed patient's case with hospitalist, Dr. Para March.  I have recommended admission and patient (and family if present) agree with this plan. Admitting physician will place admission orders.   I reviewed all nursing notes, vitals, pertinent previous records and reviewed/interpreted all EKGs, lab and urine results, imaging (as available).  ____________________________________________   FINAL CLINICAL IMPRESSION(S) / ED DIAGNOSES  Final diagnoses:  Atrial fibrillation with RVR [facility])     ED Discharge Orders     None       *  Please note:  Nafisa Olds was evaluated in Emergency Department on 08/13/2021 for the symptoms described in the history of present illness. She was evaluated in the context of the global COVID-19 pandemic, which necessitated consideration that the patient might be at risk for infection with the SARS-CoV-2 virus that causes COVID-19. Institutional protocols and algorithms that pertain to the evaluation of patients at risk for COVID-19 are in a state of rapid change based on information released by regulatory bodies including the CDC and federal and state organizations. These policies and algorithms were followed during the patient's care in the ED.  Some ED evaluations and interventions may be delayed as a result of limited staffing during and the pandemic.*   Note:  This document was prepared using Dragon voice recognition software and may include unintentional dictation errors.    Ashwika Freels, Layla Maw, DO 08/13/21 [phone]

## 2021-08-13 NOTE — ED Notes (Signed)
Ordered bariatric bed

## 2021-08-13 NOTE — ED Notes (Signed)
Pt resting comfortably at this time. NAD noted. Pt denies needs. Call bell in reach.  

## 2021-08-13 NOTE — ED Notes (Signed)
Pt given breakfast tray.   CBG was 110, no insulin given.

## 2021-08-13 NOTE — ED Notes (Signed)
Hospitalist bedside 

## 2021-08-13 NOTE — Progress Notes (Signed)
Anticoagulation monitoring(Lovenox):  55 yo female ordered Lovenox 40 mg Q24h    Filed Weights   08/12/21 2347  Weight: (!) 180.5 kg (398 lb)   BMI 77.7    Lab Results  Component Value Date   CREATININE 1.12 (H) 08/13/2021   Estimated Creatinine Clearance: 91.2 mL/min (A) (by C-G formula based on SCr of 1.12 mg/dL (H)). Hemoglobin & Hematocrit     Component Value Date/Time   HGB 15.8 (H) 08/13/2021 0032   HCT 46.6 (H) 08/13/2021 0032     Per Protocol for Patient with estCrcl > 30 ml/min and BMI > 30, will transition to Lovenox 90 mg Q24h.

## 2021-08-14 ENCOUNTER — Inpatient Hospital Stay (HOSPITAL_COMMUNITY)
Admit: 2021-08-14 | Discharge: 2021-08-14 | Disposition: A | Discharge status: Home / Self Care | Payer: Medicaid Other | Attending: Internal Medicine | Admitting: Internal Medicine

## 2021-08-14 ENCOUNTER — Encounter: Payer: Self-pay | Admitting: Internal Medicine

## 2021-08-14 DIAGNOSIS — I5021 Acute systolic (congestive) heart failure: Secondary | ICD-10-CM

## 2021-08-14 DIAGNOSIS — J4520 Mild intermittent asthma, uncomplicated: Secondary | ICD-10-CM

## 2021-08-14 DIAGNOSIS — I4891 Unspecified atrial fibrillation: Secondary | ICD-10-CM

## 2021-08-14 DIAGNOSIS — I5031 Acute diastolic (congestive) heart failure: Secondary | ICD-10-CM

## 2021-08-14 LAB — CBC
HCT: 43.9 % (ref 36.0–46.0)
Hemoglobin: 14.5 g/dL (ref 12.0–15.0)
MCH: 29.7 pg (ref 26.0–34.0)
MCHC: 33 g/dL (ref 30.0–36.0)
MCV: 90 fL (ref 80.0–100.0)
Platelets: 278 10*3/uL (ref 150–400)
RBC: 4.88 MIL/uL (ref 3.87–5.11)
RDW: 15.3 % (ref 11.5–15.5)
WBC: 11.3 10*3/uL — ABNORMAL HIGH (ref 4.0–10.5)
nRBC: 0 % (ref 0.0–0.2)

## 2021-08-14 LAB — RENAL FUNCTION PANEL
Albumin: 3.1 g/dL — ABNORMAL LOW (ref 3.5–5.0)
Anion gap: 10 (ref 5–15)
BUN: 34 mg/dL — ABNORMAL HIGH (ref 6–20)
CO2: 21 mmol/L — ABNORMAL LOW (ref 22–32)
Calcium: 8 mg/dL — ABNORMAL LOW (ref 8.9–10.3)
Chloride: 99 mmol/L (ref 98–111)
Creatinine, Ser: 1.34 mg/dL — ABNORMAL HIGH (ref 0.44–1.00)
GFR, Estimated: 47 mL/min — ABNORMAL LOW (ref >60)
Glucose, Bld: 400 mg/dL — ABNORMAL HIGH (ref 70–99)
Phosphorus: 3.3 mg/dL (ref 2.5–4.6)
Potassium: 4.2 mmol/L (ref 3.5–5.1)
Sodium: 130 mmol/L — ABNORMAL LOW (ref 135–145)

## 2021-08-14 LAB — LIPID PANEL
Cholesterol: 99 mg/dL (ref 0–200)
HDL: 33 mg/dL — ABNORMAL LOW (ref >40)
LDL Cholesterol: 54 mg/dL (ref 0–99)
Total CHOL/HDL Ratio: 3 RATIO
Triglycerides: 58 mg/dL (ref <150)
VLDL: 12 mg/dL (ref 0–40)

## 2021-08-14 LAB — GLUCOSE, CAPILLARY: Glucose-Capillary: 301 mg/dL — ABNORMAL HIGH (ref 70–99)

## 2021-08-14 LAB — CBG MONITORING, ED
Glucose-Capillary: 408 mg/dL — ABNORMAL HIGH (ref 70–99)
Glucose-Capillary: 462 mg/dL — ABNORMAL HIGH (ref 70–99)
Glucose-Capillary: 373 mg/dL — ABNORMAL HIGH (ref 70–99)

## 2021-08-14 LAB — TSH: TSH: 2.241 u[IU]/mL (ref 0.350–4.500)

## 2021-08-14 LAB — ECHOCARDIOGRAM COMPLETE: S' Lateral: 4.32 cm

## 2021-08-14 LAB — MAGNESIUM: Magnesium: 2.1 mg/dL (ref 1.7–2.4)

## 2021-08-14 MED ORDER — FUROSEMIDE 10 MG/ML IJ SOLN
40.0000 mg | Freq: Two times a day (BID) | INTRAMUSCULAR | Status: DC
  Administered 2021-08-14 – 2021-08-16 (×5): 40 mg via INTRAVENOUS
  Filled 2021-08-14 (×5): qty 4

## 2021-08-14 MED ORDER — INSULIN NPH (HUMAN) (ISOPHANE) 100 UNIT/ML ~~LOC~~ SUSP
20.0000 [IU] | Freq: Two times a day (BID) | SUBCUTANEOUS | Status: DC
  Administered 2021-08-14 – 2021-08-15 (×2): 20 [IU] via SUBCUTANEOUS
  Filled 2021-08-14 (×2): qty 10

## 2021-08-14 MED ORDER — FUROSEMIDE 10 MG/ML IJ SOLN: 40.0000 mg | Freq: Every day | INTRAMUSCULAR | Status: DC

## 2021-08-14 MED ORDER — INSULIN GLARGINE-YFGN 100 UNIT/ML ~~LOC~~ SOLN
20.0000 [IU] | Freq: Two times a day (BID) | SUBCUTANEOUS | Status: DC
  Filled 2021-08-14 (×2): qty 0.2

## 2021-08-14 MED ORDER — INSULIN ASPART 100 UNIT/ML IJ SOLN: 6.0000 [IU] | Freq: Three times a day (TID) | INTRAMUSCULAR | Status: DC

## 2021-08-14 MED ORDER — INSULIN ASPART 100 UNIT/ML IJ SOLN: 0.0000 [IU] | Freq: Three times a day (TID) | INTRAMUSCULAR | Status: DC

## 2021-08-14 MED ORDER — INSULIN ASPART 100 UNIT/ML IJ SOLN
0.0000 [IU] | Freq: Every day | INTRAMUSCULAR | Status: DC
  Administered 2021-08-15: 4 [IU] via SUBCUTANEOUS
  Filled 2021-08-14 (×2): qty 1

## 2021-08-14 MED ORDER — INSULIN ASPART 100 UNIT/ML IJ SOLN
6.0000 [IU] | Freq: Three times a day (TID) | INTRAMUSCULAR | Status: DC
  Administered 2021-08-14 – 2021-08-15 (×5): 6 [IU] via SUBCUTANEOUS
  Filled 2021-08-14 (×5): qty 1

## 2021-08-14 MED ORDER — INSULIN ASPART 100 UNIT/ML IJ SOLN
0.0000 [IU] | Freq: Three times a day (TID) | INTRAMUSCULAR | Status: DC
  Administered 2021-08-14 (×3): 20 [IU] via SUBCUTANEOUS
  Administered 2021-08-15: 4 [IU] via SUBCUTANEOUS
  Administered 2021-08-15 (×2): 15 [IU] via SUBCUTANEOUS
  Administered 2021-08-16: 7 [IU] via SUBCUTANEOUS
  Filled 2021-08-14 (×8): qty 1

## 2021-08-14 MED ORDER — LIDOCAINE 5 % EX PTCH
1.0000 | MEDICATED_PATCH | CUTANEOUS | Status: DC
  Administered 2021-08-14 – 2021-08-15 (×2): 1 via TRANSDERMAL
  Filled 2021-08-14 (×4): qty 1

## 2021-08-14 NOTE — ED Notes (Signed)
Pt and husband requesting an update on care plan.  Will message Hospitalist.

## 2021-08-14 NOTE — ED Notes (Signed)
Pt advised to not sit on the edge of the bed at this time due to feeling dizzy and nauseated. Pt is cursing and yelling at this time from the pain in her right shoulder. Pt offered nausea and pain medicine at this time, but is refusing said medication.

## 2021-08-14 NOTE — ED Notes (Signed)
Pt found to be back in bed.  Reports she was helped back in bed by husband.  Denies current needs.

## 2021-08-14 NOTE — TOC Initial Note (Addendum)
Transition of Care [facility]) - Initial/Assessment Note    Patient Details  Name: Donna Koch MRN: [id number] Date of Birth: Jul 24, 1966  Transition of Care [facility]) CM/SW Contact:    Lancaster Cellar, RN Phone Number: 08/14/2021, 3:26 PM  Clinical Narrative:                 Spoke with patient at bedside. Patient reports she has strong family support at home and is active with [facility] Was previously seen by [facility] but did not like the rotating staff. Previous PCP has retired from [employer] and she was given a list of providers to try and pick from. Does not live in [facility] so unable to utilize [facility] or medication mgmt. Patient has been paying private for medications when needed. Gets insulin at [facility] and uses [facility] card for other medications as well. Is able to check each medication on [facility] to compare prices and [facility] will match the cheapest.  Is independent with ADL's according to patient. Patient reports she knows what to eat and how to manage her Diabetes and gets frustrated with staff trying to tell her how to do it differently. Patient is very engaged with her healthcare and says she always advocates for herself. Patient is tearful when talking about her family and her desire to be discharged home before Friday to be at a family event.   Expected Discharge Plan: Home/Self Care Barriers to Discharge: Continued Medical Work up   Patient Goals and CMS Choice Patient states their goals for this hospitalization and ongoing recovery are:: Feel better      Expected Discharge Plan and Services Expected Discharge Plan: Home/Self Care       Living arrangements for the past 2 months: Single Family Home                                      Prior Living Arrangements/Services Living arrangements for the past 2 months: Single Family Home Lives with:: Spouse, Adult Children, Minor Children Patient language and need for interpreter reviewed:: Yes Do you feel  safe going back to the place where you live?: Yes      Need for Family Participation in Patient Care: Yes (Comment) Care giver support system in place?: Yes (comment)   Criminal Activity/Legal Involvement Pertinent to Current Situation/Hospitalization: No - Comment as needed  Activities of Daily Living      Permission Sought/Granted                  Emotional Assessment Appearance:: Appears stated age Attitude/Demeanor/Rapport: Engaged Affect (typically observed): Accepting Orientation: : Oriented to Self, Oriented to Place, Oriented to  Time, Oriented to Situation Alcohol / Substance Use: Not Applicable Psych Involvement: No (comment)  Admission diagnosis:  Rapid atrial fibrillation [facility]) [I48.91] Patient Active Problem List   Diagnosis Date Noted   Hyperglycemia due to type 2 diabetes mellitus (HCC) 08/13/2021   Atrial fibrillation with RVR, new onset (HCC) 08/13/2021   Acute CHF (congestive heart failure) (HCC) 08/13/2021   Morbid obesity with BMI of 70 and over, adult (HCC) 08/13/2021   Sinusitis 08/13/2021   Elevated troponin 08/13/2021   Rapid atrial fibrillation (HCC) 08/13/2021   Asthma 08/13/2021   PCP:  Oneita Hurt No Pharmacy:   [facility] [address], [place] - [address] [place] [address] Phone: [phone] Fax:  [phone]     Social Determinants of Health (SDOH) Interventions    Readmission Risk Interventions No flowsheet data found.

## 2021-08-14 NOTE — Progress Notes (Addendum)
Inpatient Diabetes Program Recommendations  AACE/ADA: New Consensus Statement on Inpatient Glycemic Control   Target Ranges:  Prepandial:   less than 140 mg/dL      Peak postprandial:   less than 180 mg/dL (1-2 hours)      Critically ill patients:  140 - 180 mg/dL   Results for Donna Koch, Donna Koch (MRN [id number]) as of 08/14/2021 10:44  Ref. Range 08/13/2021 03:05 08/13/2021 08:47 08/13/2021 16:33 08/13/2021 22:05 08/14/2021 08:51  Glucose-Capillary Latest Ref Range: 70 - 99 mg/dL 245 (H) 809 (H) 983 (H) 388 (H) 408 (H)  Results for Donna Koch, Donna Koch (MRN [id number]) as of 08/14/2021 10:44  Ref. Range 08/13/2021 00:32 08/13/2021 03:10 08/14/2021 06:57  Glucose Latest Ref Range: 70 - 99 mg/dL 673 (H)  419 (H)  Hemoglobin A1C Latest Ref Range: 4.8 - 5.6 %  9.2 (H)    Review of Glycemic Control  Diabetes history: DM2 Outpatient Diabetes medications: Novolin 70/30 45-75 units QAM (sometimes skips depending on glucose), Novolin N 0-45 units with supper, Novolin R 0-65 units with supper Current orders for Inpatient glycemic control: Novolin N 20 units BID, Novolog 0-20 units TID with meals, Novolog 0-5 units QHS, Novolog 6 units TID with meals  Inpatient Diabetes Program Recommendations:    HbgA1C: A1C 9.2% on 08/13/21 indicating an average glucose of 217 mg/dl over the past 2-3 months.  NOTE: Per chart, patient has DM2 hx and seen E. Brewster, [place] on 08/07/21 for sinusitis. Per office visit note on 08/07/21, "Patient is a diabetic and reports that her fasting sugars have been as low as 57, but average is in the 90s. After a meal sugars have been 140s to 190s. Has not had A1c or other bloodwork in over 3 years due to lack of insurance coverage. Has been using her insulin sliding scale from her PCP to dose adjust how much she is taking daily. Patient reports her previous PCP left [facility] and she has not been assigned to someone yet." Patient has no insurance or PCP. Will consult TOC to assist with  medications and follow up. Will plan to talk with patient today.  Addendum 08/14/21@13 :50-Spoke with patient about diabetes and home regimen for diabetes control. Patient reports she has not seen a primary care provider in 2-3 years. Patient states that she has been taking Novolin 70/30, Novolin N, and Novolin R for DM control and she has been buying the insulin over the counter from [place]. Patient reports she is taking Novolin 70/30 45-75 units QAM (sometimes skips depending on glucose), Novolin N 0-45 units with supper, Novolin R 0-65 units with supper for DM control. Patient states that if glucose is less than 200 mg/dl, she may not take any insulin and if it is over 300 mg/dl she may take more insulin. Patient states she knows her body and she knows what she needs. Patient states she does not like that she is not prescribed the insulins she needs and is not happy about taking Novolin N insulin BID as ordered.  Discussed that if she is taking 70/30 in the morning at home and then NPH and Regular in the evening, it is similar to taking the Novolin N BID (as 70/30 is NPH and Regular insulin).  Patient states she knows all about the insulins she is taking but again stated "the doctor is screwing with my insulin and I know what I need."   Patient states that she use to go to a clinic in [facility] but she seen multiple  providers and she notes she was not found of the last provider there that she was seeing. Patient states that over the past 3 weeks she has not taken much insulin at all (3 times over past 3 weeks).  Patient states she is taking varying dose of insulin depending on glucose; glucose has ranged from 60-300 mg/dl over the past 3 weeks. Patient states she has not been able to eat much over the past several weeks and she has mainly just been drinking fluids (states sips here and there but then later stated drinking water constantly throughout the day).   Inquired about prior A1C and patient reports  not being able to recall last A1C value. Discussed A1C results (9.2% on 08/13/21) and explained that current A1C indicates an average glucose of 217 mg/dl over the past 2-3 months. Discussed glucose and A1C goals. Discussed importance of checking CBGs and maintaining good CBG control to prevent long-term and short-term complications. Discussed importance of improving glycemic control to prevent further complications from uncontrolled diabetes. Discussed impact of nutrition, exercise, stress, sickness, and medications on diabetes control. Patient reports that she is following Carb Modified diet and she knows what foods will and won't make her sugar go up. Patient reports that she is uninsured and does not meet criteria for Medicaid. Discussed resources in the community such as clinics and medication management clinics.  Encouraged patient to get established with a local clinic for consistent follow up and assistance with improving DM control. Patient states she would be willing to see a provider consistently if they will work with her and listen to her input regarding decisions effecting her health.   Patient verbalized understanding of information discussed and reports no further questions at this time related to diabetes.  Thanks, Orlando Penner, RN, MSN, CDE Diabetes Coordinator Inpatient Diabetes Program [phone] (Team Pager from 8am to 5pm)

## 2021-08-14 NOTE — ED Notes (Signed)
Care transferred, report received from Alaina, RN 

## 2021-08-14 NOTE — ED Notes (Addendum)
Pt transported to room 259 via hospital bed by RN x 1 and ED Tech x 2, stable at transfer

## 2021-08-14 NOTE — Progress Notes (Signed)
*  PRELIMINARY RESULTS* Echocardiogram 2D Echocardiogram has been performed.  Donna Koch 08/14/2021, 9:17 AM

## 2021-08-14 NOTE — ED Notes (Signed)
Hospitalist made aware Pt has been refusing Lovenox and CBG was over 400.  Awaiting new orders.

## 2021-08-14 NOTE — ED Notes (Signed)
Pt and husband on phone w/ Hospitalist.

## 2021-08-14 NOTE — ED Notes (Signed)
Cardiologist at bedside.  

## 2021-08-14 NOTE — ED Notes (Signed)
Pt insists that she must sit in the recliner because her nerve pain is intolerable.

## 2021-08-14 NOTE — Consult Note (Signed)
Cardiology Consultation:   Patient ID: Donna Koch MRN: [id number]; DOB: Nov 22, 1966  Admit date: 08/13/2021 Date of Consult: 08/14/2021  PCP:  [facility]  Cardiologist: [facility], Dr. Kirke Corin Advanced Practice Provider:  No care team member to display Electrophysiologist:  None [id number]}    Patient Profile:   Donna Koch is a 55 y.o. female with a hx of HLD, mild OSA by 2018 study, chronic sinusitis, DM2, asthma, who is being seen today for the evaluation of atrial fibrillation with RVR and volume overload at the request of Dr. Alanda Slim.  History of Present Illness:   [name] is a 55 year old female with PMH as above.  She reports a history of heart failure in her mother but otherwise denies any family history of heart disease or arrhythmia.  She denies a history of sleep apnea, though by 2018 study, she does have a known diagnosis.  She does not use a CPAP.  She denies any tobacco or alcohol use.  She reports presenting to [facility] 08/12/2021 after her most recent episode of sinusitis and found to be in atrial fibrillation with RVR in the emergency department.  She reports a history of recurrent sinusitis.  Over the past 3 weeks, she has noticed dyspnea, weight gain, abdominal distention, and increasing lower extremity edema.  She has attributed her progressive symptoms to her sinusitis and recent antibiotics.  When discussing her lower extremity edema, she attributes this to a past injury to her left leg.  She cannot lay flat, but attributes this to aches and pains and not her breathing.  She denies any chest pain.  No shortness of breath at rest.  No tachypalpitations.  She did note some dizziness and nausea on antibiotics, alleviated with discontinuing them.  No reported signs or symptoms of bleeding.  In the emergency department, she was found to be in atrial fibrillation but declined the start of IV heparin.  Initial labs showed creatinine 1.12, BUN 29, H&H  stable, BNP 212.2, high-sensitivity troponin 75,75.  Potassium WNL.  LDL 54.  TSH 2.241.  Atrial fibrillation with ventricular rate 117 bpm, LAFB, poor R wave progression throughout all leads, PVC, left axis deviation, prolonged QTC at 514 ms. Echo ordered and still pending.  Initial EKG showed atrial fibrillation with right bundle branch block.  Chest x-ray showed cardiac enlargement.  Of note, when asked her reasons for declining anticoagulation, she reports she does not want to be on a blood thinner and does not have the financial means to afford medications, and she does not wish to discuss warfarin.  Risk of stroke was reviewed with patient stating she understands the risk of stroke.  Past Medical History:  Diagnosis Date   Asthma    Diabetes mellitus without complication (HCC)     Past Surgical History:  Procedure Laterality Date   CESAREAN SECTION     NASAL SINUS SURGERY     TONSILLECTOMY       Home Medications:  Prior to Admission medications   Medication Sig Start Date End Date Taking? Authorizing Provider  albuterol (VENTOLIN HFA) 108 (90 Base) MCG/ACT inhaler Inhale 1-2 puffs into the lungs every 6 (six) hours as needed. 04/24/18  Yes [provider]  calcium-vitamin D (OSCAL WITH D) 500-200 MG-UNIT tablet Take 1 tablet by mouth daily.   Yes [provider]  Cetirizine HCl 10 MG CAPS Take 1 tablet by mouth daily.   Yes [provider]  clarithromycin (BIAXIN) 500 MG  tablet Take 500 mg by mouth in the morning and at bedtime. For 10 days 08/07/21 08/17/21 Yes [provider]  diphenhydrAMINE (BENADRYL) 25 mg capsule Take 50 mg by mouth at bedtime as needed.   Yes [provider]  furosemide (LASIX) 20 MG tablet Take 20 mg by mouth daily. 05/05/21  Yes [provider]  insulin NPH Human (NOVOLIN N) 100 UNIT/ML injection Inject 45 Units into the skin at bedtime. 10/07/19  Yes [provider]  Insulin NPH Isophane & Regular  (NOVOLIN 70/30 [place]) Inject 45-75 Units into the skin in the morning.   Yes [provider]  insulin regular (NOVOLIN R) 100 units/mL injection Inject 65 Units into the skin at bedtime. 04/24/18  Yes [provider]  losartan (COZAAR) 50 MG tablet Take 50 mg by mouth daily. 05/05/21  Yes [provider]  Multiple Vitamin (MULTIVITAMINS PO) Take 1 tablet by mouth daily. 01/07/07  Yes [provider]    Inpatient Medications: Scheduled Meds:  atorvastatin  20 mg Oral Daily   enoxaparin (LOVENOX) injection  0.5 mg/kg Subcutaneous Q24H   fluticasone  2 spray Each Nare Daily   [START ON 08/15/2021] furosemide  40 mg Intravenous Daily   insulin aspart  0-20 Units Subcutaneous TID WC   insulin aspart  0-5 Units Subcutaneous QHS   insulin aspart  6 Units Subcutaneous TID WC   insulin NPH Human  20 Units Subcutaneous BID AC & HS   lidocaine  1 patch Transdermal Q24H   losartan  50 mg Oral Daily   metoprolol tartrate  25 mg Oral BID   Continuous Infusions:  diltiazem (CARDIZEM) infusion 12.5 mg/hr (08/14/21 1144)   PRN Meds: acetaminophen **OR** acetaminophen, levalbuterol, ondansetron **OR** ondansetron (ZOFRAN) IV, sodium chloride  Allergies:    Allergies  Allergen Reactions   Aspirin Shortness Of Breath    Pt with asthma, aspirin allergy, nasal polyps   Cephalosporins Rash    Avoid because of reaction to penicillins    Nsaids Anaphylaxis   Penicillins Anaphylaxis   Ace Inhibitors     Other reaction(s): Cough, Other (See Comments) Other Reaction: WHEEZE   Levofloxacin Nausea Only   Oxycodone     Other reaction(s): Other (See Comments) Other Reaction: N/V/DIZZY/WHEEZE    Social History:   Social History   Socioeconomic History   Marital status: Married    Spouse name: Not on file   Number of children: Not on file   Years of education: Not on file   Highest education level: Not on file  Occupational History   Not on file  Tobacco Use    Smoking status: Never   Smokeless tobacco: Never  Vaping Use   Vaping Use: Never used  Substance and Sexual Activity   Alcohol use: Not Currently   Drug use: Not on file   Sexual activity: Not on file  Other Topics Concern   Not on file  Social History Narrative   Not on file   Social Determinants of Health   Financial Resource Strain: Not on file  Food Insecurity: Not on file  Transportation Needs: Not on file  Physical Activity: Not on file  Stress: Not on file  Social Connections: Not on file  Intimate Partner Violence: Not on file    Family History:   History reviewed. No pertinent family history.  She reports family history of heart disease in her mother. ROS:  Please see the history of present illness.  Review of Systems  HENT:  Positive for congestion, ear pain and sinus pain.   Respiratory:  Positive for shortness of breath. Negative for hemoptysis.   Cardiovascular:  Positive for leg swelling. Negative for chest pain, palpitations, orthopnea and PND.  Gastrointestinal:  Positive for nausea. Negative for blood in stool, melena and vomiting.  Genitourinary:  Negative for hematuria.  Musculoskeletal:  Positive for myalgias.  Neurological:  Positive for dizziness. Negative for loss of consciousness.   All other ROS reviewed and negative.     Physical Exam/Data:   Vitals:   08/14/21 0745 08/14/21 0815 08/14/21 0845 08/14/21 1000  BP: 117/89 103/75 (!) 133/93 106/71  Pulse: 71 (!) 41 76 71  Resp: 18 18 20 18   Temp:      TempSrc:      SpO2: 98% 97% 95% 95%  Weight:      Height:        Intake/Output Summary (Last 24 hours) at 08/14/2021 1242 Last data filed at 08/13/2021 2309 Gross per 24 hour  Intake --  Output 900 ml  Net -900 ml   Last 3 Weights 08/12/2021  Weight (lbs) 398 lb  Weight (kg) 180.532 kg     Body mass index is 77.73 kg/m.  General: Obese female, NAD HEENT: normal Neck: JVD difficult to assess due to body habitus Vascular: No carotid  bruits; FA pulses 2+ bilaterally without bruits  Cardiac:  normal S1, S2; IR IR and tachycardic; no murmur, distant heart sounds Lungs: Distant lung sounds bilaterally Abd: Obese, distended, firm Ext: 1-2+ bilateral pretibial lower extremity edema Musculoskeletal:  No deformities, BUE and BLE strength normal and equal Skin: warm and dry  Neuro:  CNs 2-12 intact, no focal abnormalities noted Psych:  Normal affect   EKG:  The EKG was personally reviewed and demonstrates: Atrial fibrillation with ventricular rate 117 bpm, LAFB/RBBB, poor R wave progression throughout all leads, PVC, left axis deviation, prolonged QTC at 514 ms Telemetry:  Telemetry was personally reviewed and demonstrates: Atrial fibrillation with RVR  Relevant CV Studies: Echo pending  Laboratory Data:  High Sensitivity Troponin:   Recent Labs  Lab 08/13/21 0032 08/13/21 0310  TROPONINIHS 75* 73*     Chemistry Recent Labs  Lab 08/13/21 0032 08/14/21 0657  NA 131* 130*  K 3.8 4.2  CL 99 99  CO2 23 21*  GLUCOSE 320* 400*  BUN 29* 34*  CREATININE 1.12* 1.34*  CALCIUM 8.4* 8.0*  GFRNONAA 59* 47*  ANIONGAP 9 10    Recent Labs  Lab 08/14/21 0657  ALBUMIN 3.1*   Hematology Recent Labs  Lab 08/13/21 0032 08/14/21 0657  WBC 9.8 11.3*  RBC 5.10 4.88  HGB 15.8* 14.5  HCT 46.6* 43.9  MCV 91.4 90.0  MCH 31.0 29.7  MCHC 33.9 33.0  RDW 15.6* 15.3  PLT 294 278   BNP Recent Labs  Lab 08/13/21 0032  BNP 212.2*    DDimer No results for input(s): DDIMER in the last 168 hours.   Radiology/Studies:  08/15/21 Venous Img Lower Bilateral (DVT)  Result Date: 08/13/2021 CLINICAL DATA:  Swelling for years.  Redness in lower leg. EXAM: BILATERAL LOWER EXTREMITY VENOUS DOPPLER ULTRASOUND TECHNIQUE: Gray-scale sonography with compression, as well as color and duplex ultrasound, were performed to evaluate the deep venous system(s) from the level of the common femoral vein through the popliteal and proximal calf  veins. COMPARISON:  None. FINDINGS: VENOUS Normal compressibility of the common femoral, superficial femoral, and popliteal veins, as well as the visualized calf veins. Visualized portions  of profunda femoral vein and great saphenous vein unremarkable. No filling defects to suggest DVT on grayscale or color Doppler imaging. Doppler waveforms show normal direction of venous flow, normal respiratory plasticity and response to augmentation. Limited views of the contralateral common femoral vein are unremarkable. OTHER None. Limitations: none IMPRESSION: Study is limited due to patient body habitus. Within visualized limits, no DVT identified. Electronically Signed   By: Gerome Sam III M.D.   On: 08/13/2021 15:29   DG Chest Portable 1 View  Result Date: 08/13/2021 CLINICAL DATA:  Shortness of breath with exertion EXAM: PORTABLE CHEST 1 VIEW COMPARISON:  02/21/2017 FINDINGS: Cardiac enlargement. No edema or consolidation. No pleural effusions. No pneumothorax. Mediastinal contours appear intact. IMPRESSION: Cardiac enlargement.  No active pulmonary disease. Electronically Signed   By: Burman Nieves M.D.   On: 08/13/2021 00:51     Assessment and Plan:   Newly diagnosed atrial fibrillation with RVR --Denies tachypalpitations and a previous history of A. fib with no previous EKGs available and thus unknown chronicity.  Presented to the [facility] ED in A. fib with ventricular rates into the 160s and found to be volume overloaded in the setting.   --Continue rate control with recommendation to uptitrate BB and avoid nondihydropyridine CCB until EF known.  Labs show TSH WNL and electrolytes at goal.  Avoid amiodarone given this still carries risk of thromboembolic event with pharmacologic conversion. --Echo ordered, pending.   --Long discussion regarding anticoagulation with patient preference to defer.  We have reviewed the risk of stroke several times as well as all options for anticoagulation.  She is aware  of her CHA2DS2VASc score of at least 3 (volume overload, DM2, female) with recommendation for OAC  --No plan for DCCV given not on OAC.  Marland Kitchen --Treatment of sleep apnea recommended.  Volume overload /EF unknown --Reports a 3-week history of worsening symptoms of volume overload including dyspnea, weight gain, lower extremity edema, and abdominal distention.  She does appear volume up on exam, though body habitus does make the exam somewhat challenging.  Echo ordered and pending.  She denies a history of heart failure, though her medications did include Lasix 20 mg daily.  We did discuss that rapid ventricular rate can lead to volume overload with patient understanding.   --Continue IV diuresis as renal function tolerates.   --Continue to monitor I's/O's, daily weights.   --Further recommendations pending results of echo. Caution with fluids and IV diltiazem/none dihydropyridine calcium channel blockers until EF known.   --CHF education recommended.   --If EF reduced or wall motion abnormalities, we will need to discuss further ischemic work-up, if patient is agreeable to any further work-up at that time.  Elevated high-sensitivity troponin  --No chest pain.  Does report dyspnea in the setting of volume overload.  High-sensitivity troponin minimally elevated, flat trending.  Poor R wave progression noted throughout EKG.  Suspect Tn elevation due to supply demand ischemia in the setting of overload and RVR.  However, given her risk factors for CAD, agree with echo for further risk factor stratification as cannot rule out dyspnea as anginal equivalent.  If echo without acute abnormalities, could consider stress testing in the outpatient setting if patient were to be agreeable to this in the future.  Recommend risk factor modification, including weight loss and lifestyle changes.  HLD  -- Continue statin  HTN --Continue losartan.  DM2 --SSI, per IM.  Continue losartan given comorbid  hypertension.  2018 diagnosis of sleep apnea --CPAP recommended.  For questions or updates, please contact [facility] Please consult [email or web address] for contact info under    Signed, Lennon Alstrom, PA-C  08/14/2021 12:42 PM

## 2021-08-14 NOTE — Progress Notes (Addendum)
PROGRESS NOTE  Donna Koch ZRA:[id number] DOB: 1966/09/26   PCP: Pcp, No  Patient is from: Home.  Lives with family.  Independently ambulates at baseline.  DOA: 08/13/2021 LOS: 1  Chief complaints:  Chief Complaint  Patient presents with   Shortness of Breath     Brief Narrative / Interim history: 55 year old F with PMH of DM-2, systolic CHF, morbid obesity, asthma, GIB and "recurrent sinusitis" presenting with shortness of breath, lower extremity edema and increased weight gain, and admitted for A. fib with RVR and systolic CHF exacerbation.  Started on IV Lasix and Cardizem drip.  Cardiology consulted.  TTE with LVEF of 20 to 25% (previously 40 to 45%), not able to assess RWMA or DD  Subjective: Seen and examined earlier this morning.  No major events overnight of this morning.  Unhappy about being in the hospital, hospital bed.... she received anticoagulation for A. Fib.  She is also refusing VTE prophylaxis.  She denies chest pain, shortness of breath, GI or UTI symptoms.  Objective: Vitals:   08/14/21 1230 08/14/21 1300 08/14/21 1330 08/14/21 1415  BP:    102/81  Pulse: (!) 42 90 (!) 57 (!) 102  Resp: (!) 22 (!) 27 (!) 23 (!) 24  Temp:      TempSrc:      SpO2: 99% 98% 98% 98%  Weight:      Height:        Intake/Output Summary (Last 24 hours) at 08/14/2021 1614 Last data filed at 08/13/2021 2309 Gross per 24 hour  Intake --  Output 600 ml  Net -600 ml   Filed Weights   08/12/21 2347  Weight: (!) 180.5 kg    Examination: GENERAL: No apparent distress.  Nontoxic. HEENT: MMM.  Vision and hearing grossly intact.  NECK: Supple.  Not able to assess JVD due to body habitus RESP: 96% on RA.  No IWOB.  Fair aeration bilaterally but limited exam due to body habitus. CVS:  RRR. Heart sounds normal.  ABD/GI/GU: BS+. Abd soft, NTND.  MSK/EXT:  Moves extremities. No apparent deformity.  1+ BLE edema SKIN: no apparent skin lesion or wound NEURO: Awake and alert. Oriented  appropriately.  No apparent focal neuro deficit. PSYCH: Calm. Normal affect.   Procedures:  None  Microbiology summarized: COVID-19 and influenza PCR nonreactive.  Assessment & Plan: New onset atrial fibrillation with RVR: EKG showing A. fib with rate in the 160s.  HR improved.  CHA2DS2-VASc score 4 for CHF, DM, HTN and sex. TTE with LVEF of 20 to 25% (previously 40 to 45%), not able to assess RWMA or DD.  -Appreciate help by cardiology -Continue Cardizem drip.  Will transition when able -Continue p.o. metoprolol 25 mg twice daily -Follow TTE -Again, she refuses anticoagulation despite extensive risk-benefit discussion.  Acute on chronic systolic CHF: TTE as above.  Reports taking p.o. Lasix 20 mg daily.  Admits to fluid indiscretion. BNP 212 which could be falsely low given morbid obesity.  CXR consistent with cardiomegaly.  Started on IV Lasix.  I&O incomplete.  Only 900 cc charted.  Renal function slightly up. -Decrease IV Lasix to 40 mg daily -GDMT-losartan -Monitor fluid status, renal functions and electrolytes -Requested RN to monitor I&O closely -Sodium and fluid restriction -Follow TTE -Further plan per cardiology.    Elevated troponin-likely demand ischemia from above.  Troponin 75>> 72.  No significant delta.  EKG without acute ischemic finding. -Management as above -Follow TTE   Uncontrolled DM-2 with hyperglycemia: A1c 9.2%.  On NPH 55 units daily and 65 units twice daily.  Patient has been refusing Lantus insulin. Recent Labs  Lab 08/13/21 0847 08/13/21 1633 08/13/21 2205 08/14/21 0851 08/14/21 1210  GLUCAP 110* 300* 388* 408* 462*  -Change Lantus to Novolin and 20 units twice daily-Will increase to 30 if evening CBG remains elevated -Continue SSI-resistant -Add mealtime coverage at 6 units 3 times daily -Further adjustment as appropriate. -Could benefit from [place] or GLP-1 agonists  Essential hypertension: Normotensive. -IV Lasix, metoprolol, losartan  and Cardizem as above   "Recurrent sinusitis"-reportedly treated with doxycycline without improvement and transition to clarithromycin.  No clinical signs of bacterial sinusitis on exam.  She might have viral or allergy. -Discussed about saline nose spray and Flonase -Discontinue Biaxin  Mild intermittent asthma: Only uses as needed albuterol -Use Xopenex as needed in the setting of A. fib  Morbid obesity Body mass index is 77.73 kg/m.  -Encourage lifestyle change to lose weight.       DVT prophylaxis:  Patient refusing subcu Lovenox for VTE prophylaxis Order SCD  Code Status: Full code Family Communication: Patient and/or RN. Available if any question.  Level of care: Progressive Cardiac Status is: Inpatient  Remains inpatient appropriate because:Hemodynamically unstable, Ongoing diagnostic testing needed not appropriate for outpatient work up, IV treatments appropriate due to intensity of illness or inability to take PO, and Inpatient level of care appropriate due to severity of illness  Dispo: The patient is from: Home              Anticipated d/c is to: Home              Patient currently is not medically stable to d/c.   Difficult to place patient No       Consultants:  Cardiology   Sch Meds:  Scheduled Meds:  atorvastatin  20 mg Oral Daily   enoxaparin (LOVENOX) injection  0.5 mg/kg Subcutaneous Q24H   fluticasone  2 spray Each Nare Daily   [START ON 08/15/2021] furosemide  40 mg Intravenous Daily   insulin aspart  0-20 Units Subcutaneous TID WC   insulin aspart  0-5 Units Subcutaneous QHS   insulin aspart  6 Units Subcutaneous TID WC   insulin NPH Human  20 Units Subcutaneous BID AC & HS   lidocaine  1 patch Transdermal Q24H   losartan  50 mg Oral Daily   metoprolol tartrate  25 mg Oral BID   Continuous Infusions:  diltiazem (CARDIZEM) infusion 12.5 mg/hr (08/14/21 1144)   PRN Meds:.acetaminophen **OR** acetaminophen, levalbuterol, ondansetron **OR**  ondansetron (ZOFRAN) IV, sodium chloride  Antimicrobials: Anti-infectives (From admission, onward)    Start     Dose/Rate Route Frequency Ordered Stop   08/13/21 1000  clarithromycin (BIAXIN) tablet 500 mg  Status:  Discontinued       Note to Pharmacy: For 10 days     500 mg Oral Every 12 hours 08/13/21 0712 08/13/21 1324   08/13/21 0200  clarithromycin (BIAXIN) tablet 500 mg  Status:  Discontinued       Note to Pharmacy: For 10 days     500 mg Oral Every 12 hours 08/13/21 0153 08/13/21 0711        I have personally reviewed the following labs and images: CBC: Recent Labs  Lab 08/13/21 0032 08/14/21 0657  WBC 9.8 11.3*  NEUTROABS 6.5  --   HGB 15.8* 14.5  HCT 46.6* 43.9  MCV 91.4 90.0  PLT 294 278   BMP &GFR Recent  Labs  Lab 08/13/21 0032 08/14/21 0657  NA 131* 130*  K 3.8 4.2  CL 99 99  CO2 23 21*  GLUCOSE 320* 400*  BUN 29* 34*  CREATININE 1.12* 1.34*  CALCIUM 8.4* 8.0*  MG 2.4 2.1  PHOS  --  3.3   Estimated Creatinine Clearance: 76.3 mL/min (A) (by C-G formula based on SCr of 1.34 mg/dL (H)). Liver & Pancreas: Recent Labs  Lab 08/14/21 0657  ALBUMIN 3.1*   No results for input(s): LIPASE, AMYLASE in the last 168 hours. No results for input(s): AMMONIA in the last 168 hours. Diabetic: Recent Labs    08/13/21 0310  HGBA1C 9.2*   Recent Labs  Lab 08/13/21 0847 08/13/21 1633 08/13/21 2205 08/14/21 0851 08/14/21 1210  GLUCAP 110* 300* 388* 408* 462*   Cardiac Enzymes: No results for input(s): CKTOTAL, CKMB, CKMBINDEX, TROPONINI in the last 168 hours. No results for input(s): PROBNP in the last 8760 hours. Coagulation Profile: No results for input(s): INR, PROTIME in the last 168 hours. Thyroid Function Tests: Recent Labs    08/14/21 0657  TSH 2.241   Lipid Profile: Recent Labs    08/14/21 0657  CHOL 99  HDL 33*  LDLCALC 54  TRIG 58  CHOLHDL 3.0   Anemia Panel: No results for input(s): VITAMINB12, FOLATE, FERRITIN, TIBC, IRON,  RETICCTPCT in the last 72 hours. Urine analysis: No results found for: COLORURINE, APPEARANCEUR, LABSPEC, PHURINE, GLUCOSEU, HGBUR, BILIRUBINUR, KETONESUR, PROTEINUR, UROBILINOGEN, NITRITE, LEUKOCYTESUR Sepsis Labs: Invalid input(s): PROCALCITONIN, LACTICIDVEN  Microbiology: Recent Results (from the past 240 hour(s))  Resp Panel by RT-PCR (Flu A&B, Covid) Nasopharyngeal Swab     Status: None   Collection Time: 08/13/21 12:32 AM   Specimen: Nasopharyngeal Swab; Nasopharyngeal(NP) swabs in vial transport medium  Result Value Ref Range Status   SARS Coronavirus 2 by RT PCR NEGATIVE NEGATIVE Final    Comment: (NOTE) SARS-CoV-2 target nucleic acids are NOT DETECTED.  The SARS-CoV-2 RNA is generally detectable in upper respiratory specimens during the acute phase of infection. The lowest concentration of SARS-CoV-2 viral copies this assay can detect is 138 copies/mL. A negative result does not preclude SARS-Cov-2 infection and should not be used as the sole basis for treatment or other patient management decisions. A negative result may occur with  improper specimen collection/handling, submission of specimen other than nasopharyngeal swab, presence of viral mutation(s) within the areas targeted by this assay, and inadequate number of viral copies(<138 copies/mL). A negative result must be combined with clinical observations, patient history, and epidemiological information. The expected result is Negative.  Fact Sheet for Patients:  BloggerCourse.com  Fact Sheet for Healthcare Providers:  SeriousBroker.it  This test is no t yet approved or cleared by the [place] FDA and  has been authorized for detection and/or diagnosis of SARS-CoV-2 by FDA under an Emergency Use Authorization (EUA). This EUA will remain  in effect (meaning this test can be used) for the duration of the COVID-19 declaration under Section 564(b)(1) of the Act,  21 U.S.C.section 360bbb-3(b)(1), unless the authorization is terminated  or revoked sooner.       Influenza A by PCR NEGATIVE NEGATIVE Final   Influenza B by PCR NEGATIVE NEGATIVE Final    Comment: (NOTE) The Xpert Xpress SARS-CoV-2/FLU/RSV plus assay is intended as an aid in the diagnosis of influenza from Nasopharyngeal swab specimens and should not be used as a sole basis for treatment. Nasal washings and aspirates are unacceptable for Xpert Xpress SARS-CoV-2/FLU/RSV testing.  Fact Sheet  for Patients: BloggerCourse.com  Fact Sheet for Healthcare Providers: SeriousBroker.it  This test is not yet approved or cleared by the [place] FDA and has been authorized for detection and/or diagnosis of SARS-CoV-2 by FDA under an Emergency Use Authorization (EUA). This EUA will remain in effect (meaning this test can be used) for the duration of the COVID-19 declaration under Section 564(b)(1) of the Act, 21 U.S.C. section 360bbb-3(b)(1), unless the authorization is terminated or revoked.  Performed at [facility], [address] [place], [place] [address]     Radiology Studies: ECHOCARDIOGRAM COMPLETE  Result Date: 08/14/2021    ECHOCARDIOGRAM REPORT   Patient Name:   Donna Koch Date of Exam: 08/14/2021 Medical Rec #:  [id number]   Height:       60.0 in Accession #:    [id number]  Weight:       398.0 lb Date of Birth:  22-Dec-1965   BSA:          2.501 m Patient Age:    55 years    BP:           133/93 mmHg Patient Gender: F           HR:           76 bpm. Exam Location:  [facility] Procedure: 2D Echo, Cardiac Doppler and Color Doppler Indications:     CHF-acute diastolic I50.31  History:         Patient has no prior history of Echocardiogram examinations.                  Risk Factors:Diabetes.  Sonographer:     Cristela Blue Referring Phys:  [id number] Andris Baumann Diagnosing Phys: Lorine Bears MD  Sonographer Comments: No apical  window, no subcostal window and Technically challenging study due to limited acoustic windows. IMPRESSIONS  1. Left ventricular ejection fraction, by estimation, is 20 to 25%. The left ventricle has severely decreased function. Left ventricular endocardial border not optimally defined to evaluate regional wall motion. The left ventricular internal cavity size  was moderately dilated. There is mild left ventricular hypertrophy. Left ventricular diastolic function could not be evaluated.  2. Right ventricular systolic function was not well visualized. The right ventricular size is normal. Tricuspid regurgitation signal is inadequate for assessing PA pressure.  3. Left atrial size was moderately dilated.  4. The mitral valve was not well visualized. No evidence of mitral valve regurgitation. No evidence of mitral stenosis.  5. The aortic valve was not well visualized. Aortic valve regurgitation is not visualized. Mild to moderate aortic valve sclerosis/calcification is present, without any evidence of aortic stenosis.  6. Challenging image quality. FINDINGS  Left Ventricle: Left ventricular ejection fraction, by estimation, is 20 to 25%. The left ventricle has severely decreased function. Left ventricular endocardial border not optimally defined to evaluate regional wall motion. The left ventricular internal cavity size was moderately dilated. There is mild left ventricular hypertrophy. Left ventricular diastolic function could not be evaluated. Right Ventricle: The right ventricular size is normal. No increase in right ventricular wall thickness. Right ventricular systolic function was not well visualized. Tricuspid regurgitation signal is inadequate for assessing PA pressure. Left Atrium: Left atrial size was moderately dilated. Right Atrium: Right atrial size was not well visualized. Pericardium: There is no evidence of pericardial effusion. Mitral Valve: The mitral valve was not well visualized. No evidence of  mitral valve regurgitation. No evidence of mitral valve stenosis. Tricuspid Valve: The tricuspid valve is  not well visualized. Tricuspid valve regurgitation is not demonstrated. No evidence of tricuspid stenosis. Aortic Valve: The aortic valve was not well visualized. Aortic valve regurgitation is not visualized. Mild to moderate aortic valve sclerosis/calcification is present, without any evidence of aortic stenosis. Pulmonic Valve: The pulmonic valve was not well visualized. Pulmonic valve regurgitation is not visualized. No evidence of pulmonic stenosis. Aorta: The aortic root is normal in size and structure. Venous: The inferior vena cava was not well visualized. IAS/Shunts: No atrial level shunt detected by color flow Doppler.  LEFT VENTRICLE PLAX 2D LVIDd:         4.58 cm LVIDs:         4.32 cm LV PW:         1.64 cm LV IVS:        1.21 cm LVOT diam:     2.00 cm LVOT Area:     3.14 cm  LEFT ATRIUM         Index LA diam:    4.80 cm 1.92 cm/m                        PULMONIC VALVE AORTA                 PV Vmax:        0.60 m/s Ao Root diam: 2.10 cm PV Peak grad:   1.4 mmHg                       RVOT Peak grad: 3 mmHg   SHUNTS Systemic Diam: 2.00 cm Lorine Bears MD Electronically signed by Lorine Bears MD Signature Date/Time: 08/14/2021/1:57:15 PM    Final       Aigner Horseman T. Dorean Hiebert [facility]  If 7PM-7AM, please contact night-coverage [email or web address] 08/14/2021, 4:14 PM

## 2021-08-14 NOTE — ED Notes (Signed)
Pt upset about being uncomfortable in bed and recliner.  Pt placed in plastic visitor chair per request.

## 2021-08-15 ENCOUNTER — Encounter: Payer: Self-pay | Admitting: Internal Medicine

## 2021-08-15 DIAGNOSIS — Z9119 Patient's noncompliance with other medical treatment and regimen: Secondary | ICD-10-CM

## 2021-08-15 DIAGNOSIS — I5021 Acute systolic (congestive) heart failure: Secondary | ICD-10-CM

## 2021-08-15 LAB — GLUCOSE, CAPILLARY
Glucose-Capillary: 193 mg/dL — ABNORMAL HIGH (ref 70–99)
Glucose-Capillary: 346 mg/dL — ABNORMAL HIGH (ref 70–99)
Glucose-Capillary: 321 mg/dL — ABNORMAL HIGH (ref 70–99)
Glucose-Capillary: 189 mg/dL — ABNORMAL HIGH (ref 70–99)

## 2021-08-15 LAB — CBC
HCT: 41.3 % (ref 36.0–46.0)
Hemoglobin: 14.4 g/dL (ref 12.0–15.0)
MCH: 31.3 pg (ref 26.0–34.0)
MCHC: 34.9 g/dL (ref 30.0–36.0)
MCV: 89.8 fL (ref 80.0–100.0)
Platelets: 277 10*3/uL (ref 150–400)
RBC: 4.6 MIL/uL (ref 3.87–5.11)
RDW: 15.1 % (ref 11.5–15.5)
WBC: 9.9 10*3/uL (ref 4.0–10.5)
nRBC: 0 % (ref 0.0–0.2)

## 2021-08-15 LAB — RENAL FUNCTION PANEL
Albumin: 3.3 g/dL — ABNORMAL LOW (ref 3.5–5.0)
Anion gap: 7 (ref 5–15)
BUN: 36 mg/dL — ABNORMAL HIGH (ref 6–20)
CO2: 24 mmol/L (ref 22–32)
Calcium: 8.2 mg/dL — ABNORMAL LOW (ref 8.9–10.3)
Chloride: 101 mmol/L (ref 98–111)
Creatinine, Ser: 1.1 mg/dL — ABNORMAL HIGH (ref 0.44–1.00)
GFR, Estimated: >60 mL/min (ref >60)
Glucose, Bld: 201 mg/dL — ABNORMAL HIGH (ref 70–99)
Phosphorus: 3.2 mg/dL (ref 2.5–4.6)
Potassium: 3.5 mmol/L (ref 3.5–5.1)
Sodium: 132 mmol/L — ABNORMAL LOW (ref 135–145)

## 2021-08-15 LAB — MAGNESIUM: Magnesium: 2.2 mg/dL (ref 1.7–2.4)

## 2021-08-15 MED ORDER — INSULIN ASPART 100 UNIT/ML IJ SOLN
12.0000 [IU] | Freq: Three times a day (TID) | INTRAMUSCULAR | Status: DC
  Administered 2021-08-15 – 2021-08-16 (×3): 12 [IU] via SUBCUTANEOUS
  Filled 2021-08-15 (×4): qty 1

## 2021-08-15 MED ORDER — POTASSIUM CHLORIDE CRYS ER 20 MEQ PO TBCR
40.0000 meq | EXTENDED_RELEASE_TABLET | Freq: Once | ORAL | Status: AC
  Administered 2021-08-15: 40 meq via ORAL
  Filled 2021-08-15: qty 2

## 2021-08-15 NOTE — Plan of Care (Signed)
  Problem: Safety: Goal: Ability to remain free from injury will improve Outcome: Progressing   

## 2021-08-15 NOTE — Progress Notes (Addendum)
PROGRESS NOTE  Donna Koch WOE:[id number] DOB: August 30, 1966   PCP: Pcp, No  Patient is from: Home.  Lives with family.  Independently ambulates at baseline.  DOA: 08/13/2021 LOS: 2  Chief complaints:  Chief Complaint  Patient presents with   Shortness of Breath     Brief Narrative / Interim history: 55 year old F with PMH of DM-2, systolic CHF, morbid obesity, asthma, GIB and "recurrent sinusitis" presenting with shortness of breath, lower extremity edema and increased weight gain, and admitted for A. fib with RVR and systolic CHF exacerbation.  Started on IV Lasix and Cardizem drip.  Cardiology consulted.  TTE with LVEF of 20 to 25% (previously 40 to 45%), not able to assess RWMA or DD.  Cardiology managing.  Subjective: Seen and examined earlier this morning.  No major events overnight of this morning.  Patient and patient's husband angry about changing her medications and adding new medications.  Patient's husband asking why we gave her potassium pills and instead of banana.  They are also questioning why she is on cholesterol medication.  She says she would be going home by 08/17/21 for his son's birthday.  She states her breathing is "all right".  She denies chest pain, dizziness, GI or UTI symptoms.  Objective: Vitals:   08/15/21 0900 08/15/21 1213 08/15/21 1308 08/15/21 1603  BP:  (!) 81/61 109/89 117/90  Pulse:  76  78  Resp:  18  18  Temp:  98.1 F (36.7 C)  97.7 F (36.5 C)  TempSrc:  Oral  Oral  SpO2:  97%  98%  Weight: (!) 180.5 kg     Height:        Intake/Output Summary (Last 24 hours) at 08/15/2021 1733 Last data filed at 08/15/2021 1500 Gross per 24 hour  Intake 896.19 ml  Output 2575 ml  Net -1678.81 ml   Filed Weights   08/12/21 2347 08/14/21 2348 08/15/21 0900  Weight: (!) 180.5 kg (!) 180.5 kg (!) 180.5 kg    Examination:  GENERAL: No apparent distress.  Nontoxic. HEENT: MMM.  Vision and hearing grossly intact.  NECK: Supple.  Difficult to assess  JVD due to body habitus. RESP: 97% on RA.  No IWOB.  Fair aeration bilaterally. CVS: RRR 80 to 90 bpm. Heart sounds normal.  ABD/GI/GU: BS+. Abd soft, NTND.  MSK/EXT:  Moves extremities. No apparent deformity.  1+ pitting edema in BLE SKIN: no apparent skin lesion or wound NEURO: Awake and alert. Oriented appropriately.  No apparent focal neuro deficit. PSYCH: Looks upset  Procedures:  None  Microbiology summarized: COVID-19 and influenza PCR nonreactive.  Assessment & Plan: New onset atrial fibrillation with RVR: EKG showing A. fib with rate in the 160s.  HR improved.  CHA2DS2-VASc score 4 for CHF, DM, HTN and sex. TTE with LVEF of 20 to 25% (previously 40 to 45%), not able to assess RWMA or DD.  -Cardiology managing -On Cardizem drip and metoprolol 25 mg twice daily -Patient persistently refused anticoagulation despite risk-benefit discussion  Acute on chronic systolic CHF: TTE as above.  Reports taking p.o. Lasix 20 mg daily.  Admits to fluid indiscretion. BNP 212 which could be falsely low given morbid obesity.  CXR consistent with cardiomegaly.  Started on IV Lasix.  I&O incomplete but about 1.8 L UOP overnight.  Renal function improved. -Cardiology managing -On IV Lasix 40 mg twice daily -GDMT-on metoprolol.  Losartan on hold to create room for diuretics. -Monitor fluid status, renal functions and electrolytes -Sodium and fluid  restriction -Further plan per cardiology.   Elevated troponin-likely demand ischemia from above.  Troponin 75>> 72.  No significant delta.  EKG without acute ischemic finding.  TTE as above. -Management as above  Uncontrolled DM-2 with hyperglycemia: A1c 9.2%.  At home, she is on Novolin 70/30 45-75 units QAM (sometimes skips depending on glucose), Novolin N 0-45 units with supper, Novolin R 0-65 units with supper.  Patient refused her Novolin N last night. Recent Labs  Lab 08/14/21 1706 08/14/21 2355 08/15/21 0855 08/15/21 1206 08/15/21 1605   GLUCAP 373* 301* 193* 346* 321*  -Continue Novolin N 20 units twice daily.  Patient does not want Lantus or Levemir. -Continue SSI-resistant -Increase mealtime coverage from 6 to 12 units 3 times daily -Further adjustment as appropriate. -Could benefit from [place] or GLP-1 agonists  Essential hypertension: Normotensive. -IV Lasix, metoprolol, losartan and Cardizem as above   "Recurrent sinusitis"-reportedly treated with doxycycline without improvement and transition to clarithromycin.  No clinical signs of bacterial sinusitis on exam.  She might have viral or allergy. -Discussed about saline nose spray and Flonase -Discontinued Biaxin  Mild intermittent asthma: Only uses as needed albuterol -Use Xopenex as needed in the setting of A. Fib  Noncompliance/low health literacy-very challenging.  -Counseling as much as possible  Morbid obesity Body mass index is 72.8 kg/m.  -Encourage lifestyle change to lose weight.       DVT prophylaxis:  Patient refusing subcu Lovenox for VTE prophylaxis Order SCD  Code Status: Full code Family Communication: Updated patient's husband at bedside. Level of care: Progressive Cardiac Status is: Inpatient  Remains inpatient appropriate because:Hemodynamically unstable, Ongoing diagnostic testing needed not appropriate for outpatient work up, IV treatments appropriate due to intensity of illness or inability to take PO, and Inpatient level of care appropriate due to severity of illness  Dispo: The patient is from: Home              Anticipated d/c is to: Home              Patient currently is not medically stable to d/c.   Difficult to place patient No       Consultants:  Cardiology   Sch Meds:  Scheduled Meds:  atorvastatin  20 mg Oral Daily   enoxaparin (LOVENOX) injection  0.5 mg/kg Subcutaneous Q24H   fluticasone  2 spray Each Nare Daily   furosemide  40 mg Intravenous BID   insulin aspart  0-20 Units Subcutaneous TID WC    insulin aspart  0-5 Units Subcutaneous QHS   insulin aspart  12 Units Subcutaneous TID WC   insulin NPH Human  20 Units Subcutaneous BID AC & HS   lidocaine  1 patch Transdermal Q24H   metoprolol tartrate  25 mg Oral BID   Continuous Infusions:   PRN Meds:.acetaminophen **OR** acetaminophen, levalbuterol, ondansetron **OR** ondansetron (ZOFRAN) IV, sodium chloride  Antimicrobials: Anti-infectives (From admission, onward)    Start     Dose/Rate Route Frequency Ordered Stop   08/13/21 1000  clarithromycin (BIAXIN) tablet 500 mg  Status:  Discontinued       Note to Pharmacy: For 10 days     500 mg Oral Every 12 hours 08/13/21 0712 08/13/21 1324   08/13/21 0200  clarithromycin (BIAXIN) tablet 500 mg  Status:  Discontinued       Note to Pharmacy: For 10 days     500 mg Oral Every 12 hours 08/13/21 0153 08/13/21 6712  I have personally reviewed the following labs and images: CBC: Recent Labs  Lab 08/13/21 0032 08/14/21 0657 08/15/21 0449  WBC 9.8 11.3* 9.9  NEUTROABS 6.5  --   --   HGB 15.8* 14.5 14.4  HCT 46.6* 43.9 41.3  MCV 91.4 90.0 89.8  PLT 294 278 277   BMP &GFR Recent Labs  Lab 08/13/21 0032 08/14/21 0657 08/15/21 0449  NA 131* 130* 132*  K 3.8 4.2 3.5  CL 99 99 101  CO2 23 21* 24  GLUCOSE 320* 400* 201*  BUN 29* 34* 36*  CREATININE 1.12* 1.34* 1.10*  CALCIUM 8.4* 8.0* 8.2*  MG 2.4 2.1 2.2  PHOS  --  3.3 3.2   Estimated Creatinine Clearance: 95.5 mL/min (A) (by C-G formula based on SCr of 1.1 mg/dL (H)). Liver & Pancreas: Recent Labs  Lab 08/14/21 0657 08/15/21 0449  ALBUMIN 3.1* 3.3*   No results for input(s): LIPASE, AMYLASE in the last 168 hours. No results for input(s): AMMONIA in the last 168 hours. Diabetic: Recent Labs    08/13/21 0310  HGBA1C 9.2*   Recent Labs  Lab 08/14/21 1706 08/14/21 2355 08/15/21 0855 08/15/21 1206 08/15/21 1605  GLUCAP 373* 301* 193* 346* 321*   Cardiac Enzymes: No results for input(s): CKTOTAL,  CKMB, CKMBINDEX, TROPONINI in the last 168 hours. No results for input(s): PROBNP in the last 8760 hours. Coagulation Profile: No results for input(s): INR, PROTIME in the last 168 hours. Thyroid Function Tests: Recent Labs    08/14/21 0657  TSH 2.241   Lipid Profile: Recent Labs    08/14/21 0657  CHOL 99  HDL 33*  LDLCALC 54  TRIG 58  CHOLHDL 3.0   Anemia Panel: No results for input(s): VITAMINB12, FOLATE, FERRITIN, TIBC, IRON, RETICCTPCT in the last 72 hours. Urine analysis: No results found for: COLORURINE, APPEARANCEUR, LABSPEC, PHURINE, GLUCOSEU, HGBUR, BILIRUBINUR, KETONESUR, PROTEINUR, UROBILINOGEN, NITRITE, LEUKOCYTESUR Sepsis Labs: Invalid input(s): PROCALCITONIN, LACTICIDVEN  Microbiology: Recent Results (from the past 240 hour(s))  Resp Panel by RT-PCR (Flu A&B, Covid) Nasopharyngeal Swab     Status: None   Collection Time: 08/13/21 12:32 AM   Specimen: Nasopharyngeal Swab; Nasopharyngeal(NP) swabs in vial transport medium  Result Value Ref Range Status   SARS Coronavirus 2 by RT PCR NEGATIVE NEGATIVE Final    Comment: (NOTE) SARS-CoV-2 target nucleic acids are NOT DETECTED.  The SARS-CoV-2 RNA is generally detectable in upper respiratory specimens during the acute phase of infection. The lowest concentration of SARS-CoV-2 viral copies this assay can detect is 138 copies/mL. A negative result does not preclude SARS-Cov-2 infection and should not be used as the sole basis for treatment or other patient management decisions. A negative result may occur with  improper specimen collection/handling, submission of specimen other than nasopharyngeal swab, presence of viral mutation(s) within the areas targeted by this assay, and inadequate number of viral copies(<138 copies/mL). A negative result must be combined with clinical observations, patient history, and epidemiological information. The expected result is Negative.  Fact Sheet for Patients:   BloggerCourse.com  Fact Sheet for Healthcare Providers:  SeriousBroker.it  This test is no t yet approved or cleared by the [place] FDA and  has been authorized for detection and/or diagnosis of SARS-CoV-2 by FDA under an Emergency Use Authorization (EUA). This EUA will remain  in effect (meaning this test can be used) for the duration of the COVID-19 declaration under Section 564(b)(1) of the Act, 21 U.S.C.section 360bbb-3(b)(1), unless the authorization is terminated  or  revoked sooner.       Influenza A by PCR NEGATIVE NEGATIVE Final   Influenza B by PCR NEGATIVE NEGATIVE Final    Comment: (NOTE) The Xpert Xpress SARS-CoV-2/FLU/RSV plus assay is intended as an aid in the diagnosis of influenza from Nasopharyngeal swab specimens and should not be used as a sole basis for treatment. Nasal washings and aspirates are unacceptable for Xpert Xpress SARS-CoV-2/FLU/RSV testing.  Fact Sheet for Patients: BloggerCourse.com  Fact Sheet for Healthcare Providers: SeriousBroker.it  This test is not yet approved or cleared by the [place] FDA and has been authorized for detection and/or diagnosis of SARS-CoV-2 by FDA under an Emergency Use Authorization (EUA). This EUA will remain in effect (meaning this test can be used) for the duration of the COVID-19 declaration under Section 564(b)(1) of the Act, 21 U.S.C. section 360bbb-3(b)(1), unless the authorization is terminated or revoked.  Performed at [facility], [address], [place], [place] [address]     Radiology Studies: No results found.    Isabell Bonafede T. Gabryel Files [facility]  If 7PM-7AM, please contact night-coverage [email or web address] 08/15/2021, 5:33 PM

## 2021-08-15 NOTE — Progress Notes (Addendum)
Recommend  Progress Note  Patient Name: Donna Koch Date of Encounter: 08/15/2021  Primary Cardiologist:[facility], Dr. Kirke Corin  Subjective   Reports shoulder pain, attributed to a remote injury.  No chest pain, shortness of breath.  Denies tachypalpitations.  Declines anticoagulation and Entresto.  Reports cannot tolerate ACE inhibitor's though has been able to tolerate losartan in the past.  Inpatient Medications    Scheduled Meds:  atorvastatin  20 mg Oral Daily   enoxaparin (LOVENOX) injection  0.5 mg/kg Subcutaneous Q24H   fluticasone  2 spray Each Nare Daily   furosemide  40 mg Intravenous BID   insulin aspart  0-20 Units Subcutaneous TID WC   insulin aspart  0-5 Units Subcutaneous QHS   insulin aspart  12 Units Subcutaneous TID WC   insulin NPH Human  20 Units Subcutaneous BID AC & HS   lidocaine  1 patch Transdermal Q24H   metoprolol tartrate  25 mg Oral BID   Continuous Infusions:  PRN Meds: acetaminophen **OR** acetaminophen, levalbuterol, ondansetron **OR** ondansetron (ZOFRAN) IV, sodium chloride   Vital Signs    Vitals:   08/15/21 0833 08/15/21 0900 08/15/21 1213 08/15/21 1308  BP: 121/90  (!) 81/61 109/89  Pulse: 77  76   Resp: 18  18   Temp: 97.7 F (36.5 C)  98.1 F (36.7 C)   TempSrc: Oral  Oral   SpO2: 97%  97%   Weight:  (!) 180.5 kg    Height:        Intake/Output Summary (Last 24 hours) at 08/15/2021 1555 Last data filed at 08/15/2021 1500 Gross per 24 hour  Intake 480 ml  Output 2575 ml  Net -2095 ml   Last 3 Weights 08/15/2021 08/14/2021 08/12/2021  Weight (lbs) 398 lb 398 lb 398 lb  Weight (kg) 180.532 kg 180.532 kg 180.532 kg      Telemetry    Atrial fibrillation- Personally Reviewed  ECG    No new tracings- Personally Reviewed  Physical Exam   GEN: No acute distress.   Neck: No JVD Cardiac: IR IR, no murmurs, rubs, or gallops.  Respiratory: Distant breath sounds bilaterally GI: Obese, somewhat distended MS: Moderate bilateral  edema; No deformity. Neuro:  Nonfocal  Psych: Normal affect   Labs    High Sensitivity Troponin:   Recent Labs  Lab 08/13/21 0032 08/13/21 0310  TROPONINIHS 75* 73*      Chemistry Recent Labs  Lab 08/13/21 0032 08/14/21 0657 08/15/21 0449  NA 131* 130* 132*  K 3.8 4.2 3.5  CL 99 99 101  CO2 23 21* 24  GLUCOSE 320* 400* 201*  BUN 29* 34* 36*  CREATININE 1.12* 1.34* 1.10*  CALCIUM 8.4* 8.0* 8.2*  ALBUMIN  --  3.1* 3.3*  GFRNONAA 59* 47* >60  ANIONGAP 9 10 7      Hematology Recent Labs  Lab 08/13/21 0032 08/14/21 0657 08/15/21 0449  WBC 9.8 11.3* 9.9  RBC 5.10 4.88 4.60  HGB 15.8* 14.5 14.4  HCT 46.6* 43.9 41.3  MCV 91.4 90.0 89.8  MCH 31.0 29.7 31.3  MCHC 33.9 33.0 34.9  RDW 15.6* 15.3 15.1  PLT 294 278 277    BNP Recent Labs  Lab 08/13/21 0032  BNP 212.2*     DDimer No results for input(s): DDIMER in the last 168 hours.   Radiology    ECHOCARDIOGRAM COMPLETE  Result Date: 08/14/2021    ECHOCARDIOGRAM REPORT   Patient Name:   KARRIGAN MESSAMORE Date of Exam: 08/14/2021 Medical Rec #:  [id number]   Height:       60.0 in Accession #:    [id number]  Weight:       398.0 lb Date of Birth:  1966/06/13   BSA:          2.501 m Patient Age:    55 years    BP:           133/93 mmHg Patient Gender: F           HR:           76 bpm. Exam Location:  [facility] Procedure: 2D Echo, Cardiac Doppler and Color Doppler Indications:     CHF-acute diastolic I50.31  History:         Patient has no prior history of Echocardiogram examinations.                  Risk Factors:Diabetes.  Sonographer:     Cristela Blue Referring Phys:  [id number] Andris Baumann Diagnosing Phys: Lorine Bears MD  Sonographer Comments: No apical window, no subcostal window and Technically challenging study due to limited acoustic windows. IMPRESSIONS  1. Left ventricular ejection fraction, by estimation, is 20 to 25%. The left ventricle has severely decreased function. Left ventricular endocardial border not optimally  defined to evaluate regional wall motion. The left ventricular internal cavity size  was moderately dilated. There is mild left ventricular hypertrophy. Left ventricular diastolic function could not be evaluated.  2. Right ventricular systolic function was not well visualized. The right ventricular size is normal. Tricuspid regurgitation signal is inadequate for assessing PA pressure.  3. Left atrial size was moderately dilated.  4. The mitral valve was not well visualized. No evidence of mitral valve regurgitation. No evidence of mitral stenosis.  5. The aortic valve was not well visualized. Aortic valve regurgitation is not visualized. Mild to moderate aortic valve sclerosis/calcification is present, without any evidence of aortic stenosis.  6. Challenging image quality. FINDINGS  Left Ventricle: Left ventricular ejection fraction, by estimation, is 20 to 25%. The left ventricle has severely decreased function. Left ventricular endocardial border not optimally defined to evaluate regional wall motion. The left ventricular internal cavity size was moderately dilated. There is mild left ventricular hypertrophy. Left ventricular diastolic function could not be evaluated. Right Ventricle: The right ventricular size is normal. No increase in right ventricular wall thickness. Right ventricular systolic function was not well visualized. Tricuspid regurgitation signal is inadequate for assessing PA pressure. Left Atrium: Left atrial size was moderately dilated. Right Atrium: Right atrial size was not well visualized. Pericardium: There is no evidence of pericardial effusion. Mitral Valve: The mitral valve was not well visualized. No evidence of mitral valve regurgitation. No evidence of mitral valve stenosis. Tricuspid Valve: The tricuspid valve is not well visualized. Tricuspid valve regurgitation is not demonstrated. No evidence of tricuspid stenosis. Aortic Valve: The aortic valve was not well visualized. Aortic valve  regurgitation is not visualized. Mild to moderate aortic valve sclerosis/calcification is present, without any evidence of aortic stenosis. Pulmonic Valve: The pulmonic valve was not well visualized. Pulmonic valve regurgitation is not visualized. No evidence of pulmonic stenosis. Aorta: The aortic root is normal in size and structure. Venous: The inferior vena cava was not well visualized. IAS/Shunts: No atrial level shunt detected by color flow Doppler.  LEFT VENTRICLE PLAX 2D LVIDd:         4.58 cm LVIDs:         4.32 cm LV PW:  1.64 cm LV IVS:        1.21 cm LVOT diam:     2.00 cm LVOT Area:     3.14 cm  LEFT ATRIUM         Index LA diam:    4.80 cm 1.92 cm/m                        PULMONIC VALVE AORTA                 PV Vmax:        0.60 m/s Ao Root diam: 2.10 cm PV Peak grad:   1.4 mmHg                       RVOT Peak grad: 3 mmHg   SHUNTS Systemic Diam: 2.00 cm Lorine Bears MD Electronically signed by Lorine Bears MD Signature Date/Time: 08/14/2021/1:57:15 PM    Final     Cardiac Studies   Echo 08/14/2021  1. Left ventricular ejection fraction, by estimation, is 20 to 25%. The  left ventricle has severely decreased function. Left ventricular  endocardial border not optimally defined to evaluate regional wall motion.  The left ventricular internal cavity size   was moderately dilated. There is mild left ventricular hypertrophy. Left  ventricular diastolic function could not be evaluated.   2. Right ventricular systolic function was not well visualized. The right  ventricular size is normal. Tricuspid regurgitation signal is inadequate  for assessing PA pressure.   3. Left atrial size was moderately dilated.   4. The mitral valve was not well visualized. No evidence of mitral valve  regurgitation. No evidence of mitral stenosis.   5. The aortic valve was not well visualized. Aortic valve regurgitation  is not visualized. Mild to moderate aortic valve sclerosis/calcification  is  present, without any evidence of aortic stenosis.   6. Challenging image quality.   Patient Profile     55 y.o. female with history of HLD, mild OSA by 2018 study, chronic sinusitis, DM2, asthma, who is being seen today for the evaluation of atrial fibrillation with RVR and volume overload.  Assessment & Plan    Newly diagnosed atrial fibrillation with RVR --Denies tachypalpitations and a previous history of A. fib with no previous EKGs available and thus unknown chronicity.  Presented to the [facility] ED in A. fib with ventricular rates into the 160s and found to be volume overloaded in the setting.  Echo shows reduced EF 20 to 25%. --Continue rate control with beta-blocker.  Currently ventricular rate is well controlled with recommendation to uptitrate beta-blocker if needed s/p discontinuation of IV Cardizem this a.m given reduced EF on echo and also low BP.  Amiodarone not recommended as not therapeutically anticoagulated. TSH WNL maintain electrolytes at goal.. --Long discussion regarding anticoagulation with patient still declining anticoagulation.  She is aware of her CHA2DS2VASc score of at least 3 (volume overload, DM2, female) with recommendation for [facility] to reduce the risk of stroke. --No plan for DCCV given not on OAC.  Marland Kitchen --Treatment of sleep apnea recommended.   Acute on chronic HFrEF --Reports a 3-week history of worsening symptoms of volume overload including dyspnea, weight gain, lower extremity edema, and abdominal distention.  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  Ideally, further ischemic work-up did, though uncertain if patient will proceed and would also need to consider if she would be compliant  with antiplatelet therapy if any intervention was recommended. --Continue IV diuresis as renal function tolerates.   --Continue to monitor I's/O's, daily weights.   --CHF education recommended.   --Escalation of GDMT limited  by patient declining Entresto and recent low BP.  Losartan held.  Could consider spironolactone/SGLT2 inhibitor and restart of Losartan in the future if BP/Cr allow - reassess escalation of GDMT before discharge.     Elevated high-sensitivity troponin, supply demand ischemia --No chest pain. High-sensitivity troponin minimally elevated, flat trending.  Poor R wave progression noted throughout EKG.  Suspect Tn elevation due to supply demand ischemia in the setting of overload and RVR.  Cannot completely rule out ischemia given risk factors for CAD and reduced EF by echo and reduced EF, and could consider further ischemic work-up as outlined directly above, though given patient declines most medications.  Compliance would need to be considered before any intervention could be pursued.   HLD  -- Continue statin   HTN -- Holding losartan given up titration of beta-blocker and low BP.  Does not tolerate ACE inhibitor's or Entresto.  Restart losartan if able before discharge.   DM2 --SSI, per IM.  Holding losartan given low BP to allow for up titration of beta-blocker -restart before discharge if able given comorbid hypertension.   2018 diagnosis of sleep apnea --CPAP recommended.    For questions or updates, please contact [facility] Please consult [email or web address] for contact info under        Signed, Lennon Alstrom, PA-C  08/15/2021, 3:55 PM

## 2021-08-15 NOTE — Progress Notes (Signed)
Inpatient Diabetes Program Recommendations  AACE/ADA: New Consensus Statement on Inpatient Glycemic Control   Target Ranges:  Prepandial:   less than 140 mg/dL      Peak postprandial:   less than 180 mg/dL (1-2 hours)      Critically ill patients:  140 - 180 mg/dL  Results for Donna Koch, Donna Koch (MRN [id number]) as of 08/15/2021 11:12  Ref. Range 08/14/2021 08:51 08/14/2021 12:10 08/14/2021 17:06 08/14/2021 23:55 08/15/2021 08:55  Glucose-Capillary Latest Ref Range: 70 - 99 mg/dL 867 (H)  Novolog 26 units  462 (H)  Novolog 26 units  NPH 20 units 373 (H)  Novolog 26 units 301 (H)  Novolog 4 units 193 (H)   Review of Glycemic Control  Diabetes history: DM2 Outpatient Diabetes medications: Novolin 70/30 45-75 units QAM (sometimes skips depending on glucose), Novolin N 0-45 units with supper, Novolin R 0-65 units with supper Current orders for Inpatient glycemic control: Novolin N 20 units BID, Novolog 0-20 units TID with meals, Novolog 0-5 units QHS, Novolog 6 units TID with meals   Inpatient Diabetes Program Recommendations:     Insulin: In reviewing chart, noted NPH 20 units was not given at bedtime last night (charted as refused). Therefore, would not recommend any changes with NPH dose at this time. Nursing, please encourage patient to take insulin as ordered. Please consider increasing meal coverage to Novolog 15 units TID with meals.  HbgA1C: A1C 9.2% on 08/13/21 indicating an average glucose of 217 mg/dl over the past 2-3 months. Patient needs to establish care with primary care provider for assistance with improving DM control.  Thanks, Orlando Penner, RN, MSN, CDE Diabetes Coordinator Inpatient Diabetes Program [phone] (Team Pager from 8am to 5pm)

## 2021-08-16 LAB — GLUCOSE, CAPILLARY
Glucose-Capillary: 105 mg/dL — ABNORMAL HIGH (ref 70–99)
Glucose-Capillary: 214 mg/dL — ABNORMAL HIGH (ref 70–99)
Glucose-Capillary: 220 mg/dL — ABNORMAL HIGH (ref 70–99)
Glucose-Capillary: 243 mg/dL — ABNORMAL HIGH (ref 70–99)

## 2021-08-16 LAB — RENAL FUNCTION PANEL
Albumin: 3.3 g/dL — ABNORMAL LOW (ref 3.5–5.0)
Anion gap: 10 (ref 5–15)
BUN: 31 mg/dL — ABNORMAL HIGH (ref 6–20)
CO2: 23 mmol/L (ref 22–32)
Calcium: 8.5 mg/dL — ABNORMAL LOW (ref 8.9–10.3)
Chloride: 99 mmol/L (ref 98–111)
Creatinine, Ser: 1.01 mg/dL — ABNORMAL HIGH (ref 0.44–1.00)
GFR, Estimated: >60 mL/min (ref >60)
Glucose, Bld: 98 mg/dL (ref 70–99)
Phosphorus: 3.1 mg/dL (ref 2.5–4.6)
Potassium: 3.8 mmol/L (ref 3.5–5.1)
Sodium: 132 mmol/L — ABNORMAL LOW (ref 135–145)

## 2021-08-16 LAB — CBC
HCT: 46.4 % — ABNORMAL HIGH (ref 36.0–46.0)
Hemoglobin: 15.2 g/dL — ABNORMAL HIGH (ref 12.0–15.0)
MCH: 29.3 pg (ref 26.0–34.0)
MCHC: 32.8 g/dL (ref 30.0–36.0)
MCV: 89.6 fL (ref 80.0–100.0)
Platelets: 293 10*3/uL (ref 150–400)
RBC: 5.18 MIL/uL — ABNORMAL HIGH (ref 3.87–5.11)
RDW: 15.4 % (ref 11.5–15.5)
WBC: 8.4 10*3/uL (ref 4.0–10.5)
nRBC: 0 % (ref 0.0–0.2)

## 2021-08-16 LAB — MAGNESIUM: Magnesium: 2.3 mg/dL (ref 1.7–2.4)

## 2021-08-16 MED ORDER — METOPROLOL TARTRATE 25 MG PO TABS: 25.0000 mg | ORAL_TABLET | Freq: Four times a day (QID) | ORAL | Status: DC

## 2021-08-16 MED ORDER — INSULIN NPH (HUMAN) (ISOPHANE) 100 UNIT/ML ~~LOC~~ SUSP
25.0000 [IU] | Freq: Every day | SUBCUTANEOUS | Status: DC
  Filled 2021-08-16: qty 10

## 2021-08-16 NOTE — Progress Notes (Signed)
Inpatient Diabetes Program Recommendations  AACE/ADA: New Consensus Statement on Inpatient Glycemic Control   Target Ranges:  Prepandial:   less than 140 mg/dL      Peak postprandial:   less than 180 mg/dL (1-2 hours)      Critically ill patients:  140 - 180 mg/dL   Results for LIVIAN, VANDERBECK (MRN [id number]) as of 08/16/2021 10:25  Ref. Range 08/15/2021 08:55 08/15/2021 12:06 08/15/2021 16:05 08/15/2021 21:07 08/16/2021 08:01  Glucose-Capillary Latest Ref Range: 70 - 99 mg/dL 102 (H)  Novolog 10 units 346 (H)  Novolog 21 units  NPH 20 units 321 (H)  Novolog 17 units 189 (H) 105 (H)    Review of Glycemic Control  Diabetes history: DM2 Outpatient Diabetes medications: Novolin 70/30 45-75 units QAM (sometimes skips depending on glucose), Novolin N 0-45 units with supper, Novolin R 0-65 units with supper Current orders for Inpatient glycemic control: Novolin N 20 units BID, Novolog 0-20 units TID with meals, Novolog 0-5 units QHS, Novolog 12 units TID with meals   Inpatient Diabetes Program Recommendations:     Insulin: In reviewing chart, noted NPH 20 units was not given at bedtime last night (charted as refused). Fasting glucose 105 mg/dl today. Please consider changing NPH to 25 units daily (in AM).  Thanks, Orlando Penner, RN, MSN, CDE Diabetes Coordinator Inpatient Diabetes Program [phone] (Team Pager from 8am to 5pm)

## 2021-08-16 NOTE — Progress Notes (Signed)
Patient has left the building with her husband

## 2021-08-16 NOTE — Progress Notes (Signed)
Progress Note  Patient Name: Donna Koch Date of Encounter: 08/16/2021  Primary Cardiologist:New, Dr. Kirke Corin  Subjective   No CP or SOB.  Inpatient Medications    Scheduled Meds:  atorvastatin  20 mg Oral Daily   enoxaparin (LOVENOX) injection  0.5 mg/kg Subcutaneous Q24H   fluticasone  2 spray Each Nare Daily   furosemide  40 mg Intravenous BID   insulin aspart  0-20 Units Subcutaneous TID WC   insulin aspart  0-5 Units Subcutaneous QHS   insulin aspart  12 Units Subcutaneous TID WC   insulin NPH Human  20 Units Subcutaneous BID AC & HS   lidocaine  1 patch Transdermal Q24H   metoprolol tartrate  25 mg Oral BID   Continuous Infusions:  PRN Meds: acetaminophen **OR** acetaminophen, levalbuterol, ondansetron **OR** ondansetron (ZOFRAN) IV, sodium chloride   Vital Signs    Vitals:   08/15/21 2013 08/16/21 0057 08/16/21 0349 08/16/21 0758  BP: 116/85 98/76 106/88 (!) 115/91  Pulse: (!) 107 99 (!) 109 64  Resp: 20   18  Temp: 97.8 F (36.6 C) (!) 97.5 F (36.4 C) 98.2 F (36.8 C) 97.6 F (36.4 C)  TempSrc: Oral Oral Oral Oral  SpO2: 99% 100% 98% 99%  Weight:   (!) 180.3 kg   Height:        Intake/Output Summary (Last 24 hours) at 08/16/2021 1023 Last data filed at 08/16/2021 1015 Gross per 24 hour  Intake 703.66 ml  Output 1701 ml  Net -997.34 ml    Last 3 Weights 08/16/2021 08/15/2021 08/14/2021  Weight (lbs) 397 lb 7.8 oz 398 lb 398 lb  Weight (kg) 180.3 kg 180.532 kg 180.532 kg      Telemetry    Atrial fibrillation with most recent ventricular rates in the 140s to 160s and previously in the 110s before discontinuation of IV Cardizem yesterday 9/20- Personally Reviewed  ECG    No new tracings- Personally Reviewed  Physical Exam   GEN: No acute distress.   Neck: No JVD Cardiac: IRIR and tachycardic, no murmurs, rubs, or gallops.  Respiratory: Distant breath sounds bilaterally GI: Obese, distended MS: Moderate bilateral edema; No deformity. Neuro:   Nonfocal  Psych: Normal affect   Labs    High Sensitivity Troponin:   Recent Labs  Lab 08/13/21 0032 08/13/21 0310  TROPONINIHS 75* 73*       Chemistry Recent Labs  Lab 08/14/21 0657 08/15/21 0449 08/16/21 0405  NA 130* 132* 132*  K 4.2 3.5 3.8  CL 99 101 99  CO2 21* 24 23  GLUCOSE 400* 201* 98  BUN 34* 36* 31*  CREATININE 1.34* 1.10* 1.01*  CALCIUM 8.0* 8.2* 8.5*  ALBUMIN 3.1* 3.3* 3.3*  GFRNONAA 47* >60 >60  ANIONGAP 10 7 10       Hematology Recent Labs  Lab 08/14/21 0657 08/15/21 0449 08/16/21 0405  WBC 11.3* 9.9 8.4  RBC 4.88 4.60 5.18*  HGB 14.5 14.4 15.2*  HCT 43.9 41.3 46.4*  MCV 90.0 89.8 89.6  MCH 29.7 31.3 29.3  MCHC 33.0 34.9 32.8  RDW 15.3 15.1 15.4  PLT 278 277 293     BNP Recent Labs  Lab 08/13/21 0032  BNP 212.2*      DDimer No results for input(s): DDIMER in the last 168 hours.   Radiology    No results found.  Cardiac Studies   Echo 08/14/2021  1. Left ventricular ejection fraction, by estimation, is 20 to 25%. The  left ventricle has  severely decreased function. Left ventricular  endocardial border not optimally defined to evaluate regional wall motion.  The left ventricular internal cavity size   was moderately dilated. There is mild left ventricular hypertrophy. Left  ventricular diastolic function could not be evaluated.   2. Right ventricular systolic function was not well visualized. The right  ventricular size is normal. Tricuspid regurgitation signal is inadequate  for assessing PA pressure.   3. Left atrial size was moderately dilated.   4. The mitral valve was not well visualized. No evidence of mitral valve  regurgitation. No evidence of mitral stenosis.   5. The aortic valve was not well visualized. Aortic valve regurgitation  is not visualized. Mild to moderate aortic valve sclerosis/calcification  is present, without any evidence of aortic stenosis.   6. Challenging image quality.    Echo 10/2012 INTERPRETATION ---------------------------------------------------------------   MILD LV DYSFUNCTION (See above)   VALVULAR REGURGITATION: TRIVIAL MR, TRIVIAL TR   NO VALVULAR STENOSIS   DIASTOLIC RELAXATION ABNORMALITY  Catheterization 09/25/2001 REFERRING PHYSICIAN:  Edwena Bunde, M.D.   REASON FOR CATHETERIZATION:  Anterior myocardial infarction complicated by  heart failure.   PROCEDURE:  Left heart catheterization with coronary angiography and single  plane left ventriculography.   SUMMARY OF FINDINGS:  1. A complete cardiac catheterization report is present in the patient's  medical record.  This is a summary of the findings.  2. Single plane RAO left ventriculogram revealed an ejection fraction  calculated at 45% with anterior wall akinesis.  The ejection fraction  visually looked more in the 40-45% range.  The LVEDP preangiogram was  elevated at 26-29 mm/m.  Trivial mitral regurgitation was present.  3. The coronary angiography revealed no angiographically significant  disease.    Patient Profile     55 y.o. female with history of HLD, mild OSA by 2018 study, chronic sinusitis, DM2, asthma, who is being seen today for the evaluation of atrial fibrillation with RVR and volume overload.  Assessment & Plan    Atrial fibrillation with RVR --Denies tachypalpitations and previous history of A. Fib   unknown chronicity.  Ventricular rates improved from ED but still suboptimal with Lopressor increased further this morning to 25 mg 3 times daily.  IV Cardizem discontinued yesterday given echo shows reduced EF 20 to 25%.  Continue to uptitrate Lopressor as needed for optimal rate control.  Amiodarone not recommended given risk of pharmacologic conversion with patient not agreeable to anticoagulation.  TSH WNL. Continue Lopressor and titrate as needed for optimal rate control. Ideally, consolidate to Toprol before discharge.   Refuses anticoagulation and  aware of associated risk of stroke. CHA2DS2VASc score of at least 3 (volume overload, DM2, female) with recommendation for [facility] to reduce the risk of stroke.   No plan for DCCV given not on OAC.  Treatment of sleep apnea recommended.   Acute on chronic HFrEF --Breathing improved.  Reports a 3-week history of worsening symptoms.  Denies previous history of heart failure, though EMR confirms a history of HF several years.  Previous 2002 cath with LVEDP 26 to 29 mmHg.  Most recent echo shows EF reduced from previous with LVEF 20 to 25% and 2004 echo EF 45%.  Given her uncontrolled ventricular rates, suspected tachycardia induced cardiomyopathy suspected, though cannot completely rule out coronary insufficiency.  Ideally, further ischemic work-up recommended, though uncertain if patient will be agreeable to proceed and compliance with medications would need confirmed in the event any intervention was needed. Continue IV diuresis  as renal function tolerates.   Monitor I's/O's, daily weights.   CHF education.   Escalation of GDMT as tolerated by BP and if patient is agreeable. Declined Entresto.  Losartan held for hypotension and to allow for escalation of rate control with restart before discharge if able.  Cannot tolerate lisinopril.  Could consider future addition of spironolactone/SGLT2 inhibitor as BP/renal function allow.  Defer for now.   Elevated high-sensitivity troponin, supply demand ischemia --No chest pain. High-sensitivity troponin minimally elevated, flat trending.  Poor R wave progression noted throughout EKG.  Suspect Tn elevation due to supply demand ischemia in the setting of overload and RVR.  Cannot completely rule out CAD, though uncertain if patient would be agreeable to any ischemic work-up in the future and compliance would need to be considered before any intervention could be pursued.  Previous 2002 LHC, performed s/p anterior MI complicated by heart failure, and shows LVEDP 26 to  29 mmHg with no significant CAD. Recommend risk factor modification.   HLD  -- Continue statin   HTN --PTA losartan held to allow for up titration of rate control in the setting of soft BP.  Declines Entresto.  Continue beta-blocker.  Restart losartan if able before discharge.   DM2 --SSI, per IM.  Restart losartan before discharge if able given comorbid hypertension.   2018 diagnosis of sleep apnea --CPAP recommended.  Previous diagnosis of sleep apnea.    For questions or updates, please contact [facility] Please consult [email or web address] for contact info under        Signed, Lennon Alstrom, PA-C  08/16/2021, 10:23 AM

## 2021-08-16 NOTE — Evaluation (Signed)
Physical Therapy Evaluation Patient Details Name: Donna Koch MRN: [id number] DOB: 02-Oct-1966 Today's Date: 08/16/2021  History of Present Illness  Donna Koch is a 55 y.o. female with medical history significant for Diabetes, asthma who presents to the ED with a several day history of shortness of breath with exertion as well as lower extremity edema and weight gain.   Clinical Impression  Patient is pleasant, very talkative, denies sob at this time. Appears to be at baseline level of mobility. Performed all mobility tasks in room independently. She does not have any skilled PT needs at this time. Will sign off.          Recommendations for follow up therapy are one component of a multi-disciplinary discharge planning process, led by the attending physician.  Recommendations may be updated based on patient status, additional functional criteria and insurance authorization.  Follow Up Recommendations No PT follow up    Equipment Recommendations  None recommended by PT    Recommendations for Other Services       Precautions / Restrictions Precautions Precautions: None Restrictions Weight Bearing Restrictions: No      Mobility  Bed Mobility Overal bed mobility: Independent                  Transfers Overall transfer level: Independent Equipment used: None                Ambulation/Gait Ambulation/Gait assistance: Independent Gait Distance (Feet): 25 Feet Assistive device: None Gait Pattern/deviations: WFL(Within Functional Limits) Gait velocity: WNL   General Gait Details: patient independent with ambulation in room  Stairs            Wheelchair Mobility    Modified Rankin (Stroke Patients Only)       Balance Overall balance assessment: Independent                                           Pertinent Vitals/Pain Pain Assessment: Faces Faces Pain Scale: Hurts a little bit Pain Location: R shoulder Pain Descriptors /  Indicators: Discomfort Pain Intervention(s): Monitored during session    Home Living Family/patient expects to be discharged to:: Private residence Living Arrangements: Spouse/significant other;Children Available Help at Discharge: Family;Available 24 hours/day Type of Home: House Home Access: Ramped entrance     Home Layout: One level Home Equipment: None      Prior Function Level of Independence: Independent               Hand Dominance        Extremity/Trunk Assessment   Upper Extremity Assessment Upper Extremity Assessment: Defer to OT evaluation    Lower Extremity Assessment Lower Extremity Assessment: Overall WFL for tasks assessed    Cervical / Trunk Assessment Cervical / Trunk Assessment: Normal  Communication   Communication: No difficulties  Cognition Arousal/Alertness: Awake/alert Behavior During Therapy: WFL for tasks assessed/performed Overall Cognitive Status: Within Functional Limits for tasks assessed                                        General Comments      Exercises     Assessment/Plan    PT Assessment Patent does not need any further PT services  PT Problem List         PT Treatment Interventions  PT Goals (Current goals can be found in the Care Plan section)  Acute Rehab PT Goals Patient Stated Goal: return home by tomorrow PT Goal Formulation: With patient Time For Goal Achievement: 08/17/21 Potential to Achieve Goals: Good    Frequency     Barriers to discharge        Co-evaluation               AM-PAC PT "6 Clicks" Mobility  Outcome Measure Help needed turning from your back to your side while in a flat bed without using bedrails?: None Help needed moving from lying on your back to sitting on the side of a flat bed without using bedrails?: None Help needed moving to and from a bed to a chair (including a wheelchair)?: None Help needed standing up from a chair using your arms (e.g.,  wheelchair or bedside chair)?: None Help needed to walk in hospital room?: None Help needed climbing 3-5 steps with a railing? : A Little 6 Click Score: 23    End of Session   Activity Tolerance: Patient tolerated treatment well Patient left: in bed Nurse Communication: Mobility status PT Visit Diagnosis: Muscle weakness (generalized) (M62.81)    Time: 5681-2751 PT Time Calculation (min) (ACUTE ONLY): 22 min   Charges:   PT Evaluation $PT Eval Low Complexity: 1 Low          Tilton Marsalis, PT, GCS 08/16/21,9:36 AM

## 2021-08-16 NOTE — Progress Notes (Signed)
PROGRESS NOTE    Donna Koch  JJK:[id number] DOB: 02/04/1966 DOA: 08/13/2021 PCP: Pcp, No   Assessment & Plan:   Principal Problem:   Atrial fibrillation with RVR, new onset (HCC) Active Problems:   Hyperglycemia due to type 2 diabetes mellitus (HCC)   Acute CHF (congestive heart failure) (HCC)   Morbid obesity with BMI of 70 and over, adult (HCC)   Sinusitis   Elevated troponin   Rapid atrial fibrillation (HCC)   Asthma   Acute HFrEF (heart failure with reduced ejection fraction) (HCC)   A. fib: w/ RVR. New onset. TTE with LVEF of 20 to 25% (previously 40 to 45%), not able to assess RWMA or DD. Cardizem drip was d/c, Continue  metoprolol. Patient persistently refused anticoagulation despite risk-benefit discussion  Acute on chronic systolic CHF: TTE as above. Continue on IV lasix. Monitor I/Os and daily weights. Admits to not watching how much fluid she intakes    Elevated troponins: likely demand ischemia from above.     DM2: poorly controlled w/ HbA1c 9.2%. Continue on novolog TID, SSI w/ accuchecks   HTN: continue on metoprolol   Recurrent sinusitis: as per pt. Reportedly treated with doxycycline without improvement and transition to clarithromycin.  No clinical signs of bacterial sinusitis on exam.     Mild intermittent asthma: continue on bronchodilators   Noncompliance/low health literacy: very challenging. Received counseling    Morbid obesity: BMI 72.8. Complicates overall care and prognosis    DVT prophylaxis: lovenox  Code Status: full  Family Communication: discussed pt's care w/ pt's family at bedside and answered their questions  Disposition Plan: likely d/c back home   Level of care: Progressive Cardiac  Status is: Inpatient  Remains inpatient appropriate because:IV treatments appropriate due to intensity of illness or inability to take PO and Inpatient level of care appropriate due to severity of illness  Dispo: The patient is from: Home               Anticipated d/c is to: Home              Patient currently : is not medically stable    Difficult to place patient: unclear   Consultants:  Cardio   Procedures:   Antimicrobials:    Subjective: Pt c/o fatigue   Objective: Vitals:   08/15/21 2013 08/16/21 0057 08/16/21 0349 08/16/21 0758  BP: 116/85 98/76 106/88 (!) 115/91  Pulse: (!) 107 99 (!) 109 64  Resp: 20   18  Temp: 97.8 F (36.6 C) (!) 97.5 F (36.4 C) 98.2 F (36.8 C) 97.6 F (36.4 C)  TempSrc: Oral Oral Oral Oral  SpO2: 99% 100% 98% 99%  Weight:   (!) 180.3 kg   Height:        Intake/Output Summary (Last 24 hours) at 08/16/2021 0810 Last data filed at 08/16/2021 0230 Gross per 24 hour  Intake 763.66 ml  Output 2201 ml  Net -1437.34 ml   Filed Weights   08/14/21 2348 08/15/21 0900 08/16/21 0349  Weight: (!) 180.5 kg (!) 180.5 kg (!) 180.3 kg    Examination:  General exam: Appears calm and comfortable. Morbidly obese   Respiratory system: diminished breath sounds b/l Cardiovascular system: irregularly irregular. No  rubs, gallops or clicks.  Gastrointestinal system: Abdomen is obese, soft and nontender. Normal bowel sounds heard. Central nervous system: Alert and oriented. Moves all extremities Psychiatry: Judgement and insight appear normal. Mood & affect appropriate.     Data Reviewed: I have  personally reviewed following labs and imaging studies  CBC: Recent Labs  Lab 08/13/21 0032 08/14/21 0657 08/15/21 0449 08/16/21 0405  WBC 9.8 11.3* 9.9 8.4  NEUTROABS 6.5  --   --   --   HGB 15.8* 14.5 14.4 15.2*  HCT 46.6* 43.9 41.3 46.4*  MCV 91.4 90.0 89.8 89.6  PLT 294 278 277 293   Basic Metabolic Panel: Recent Labs  Lab 08/13/21 0032 08/14/21 0657 08/15/21 0449 08/16/21 0405  NA 131* 130* 132* 132*  K 3.8 4.2 3.5 3.8  CL 99 99 101 99  CO2 23 21* 24 23  GLUCOSE 320* 400* 201* 98  BUN 29* 34* 36* 31*  CREATININE 1.12* 1.34* 1.10* 1.01*  CALCIUM 8.4* 8.0* 8.2* 8.5*  MG 2.4 2.1  2.2 2.3  PHOS  --  3.3 3.2 3.1   GFR: Estimated Creatinine Clearance: 103.9 mL/min (A) (by C-G formula based on SCr of 1.01 mg/dL (H)). Liver Function Tests: Recent Labs  Lab 08/14/21 0657 08/15/21 0449 08/16/21 0405  ALBUMIN 3.1* 3.3* 3.3*   No results for input(s): LIPASE, AMYLASE in the last 168 hours. No results for input(s): AMMONIA in the last 168 hours. Coagulation Profile: No results for input(s): INR, PROTIME in the last 168 hours. Cardiac Enzymes: No results for input(s): CKTOTAL, CKMB, CKMBINDEX, TROPONINI in the last 168 hours. BNP (last 3 results) No results for input(s): PROBNP in the last 8760 hours. HbA1C: No results for input(s): HGBA1C in the last 72 hours. CBG: Recent Labs  Lab 08/15/21 0855 08/15/21 1206 08/15/21 1605 08/15/21 2107 08/16/21 0801  GLUCAP 193* 346* 321* 189* 105*   Lipid Profile: Recent Labs    08/14/21 0657  CHOL 99  HDL 33*  LDLCALC 54  TRIG 58  CHOLHDL 3.0   Thyroid Function Tests: Recent Labs    08/14/21 0657  TSH 2.241   Anemia Panel: No results for input(s): VITAMINB12, FOLATE, FERRITIN, TIBC, IRON, RETICCTPCT in the last 72 hours. Sepsis Labs: No results for input(s): PROCALCITON, LATICACIDVEN in the last 168 hours.  Recent Results (from the past 240 hour(s))  Resp Panel by RT-PCR (Flu A&B, Covid) Nasopharyngeal Swab     Status: None   Collection Time: 08/13/21 12:32 AM   Specimen: Nasopharyngeal Swab; Nasopharyngeal(NP) swabs in vial transport medium  Result Value Ref Range Status   SARS Coronavirus 2 by RT PCR NEGATIVE NEGATIVE Final    Comment: (NOTE) SARS-CoV-2 target nucleic acids are NOT DETECTED.  The SARS-CoV-2 RNA is generally detectable in upper respiratory specimens during the acute phase of infection. The lowest concentration of SARS-CoV-2 viral copies this assay can detect is 138 copies/mL. A negative result does not preclude SARS-Cov-2 infection and should not be used as the sole basis for  treatment or other patient management decisions. A negative result may occur with  improper specimen collection/handling, submission of specimen other than nasopharyngeal swab, presence of viral mutation(s) within the areas targeted by this assay, and inadequate number of viral copies(<138 copies/mL). A negative result must be combined with clinical observations, patient history, and epidemiological information. The expected result is Negative.  Fact Sheet for Patients:  BloggerCourse.com  Fact Sheet for Healthcare Providers:  SeriousBroker.it  This test is no t yet approved or cleared by the [place] FDA and  has been authorized for detection and/or diagnosis of SARS-CoV-2 by FDA under an Emergency Use Authorization (EUA). This EUA will remain  in effect (meaning this test can be used) for the duration of the COVID-19  declaration under Section 564(b)(1) of the Act, 21 U.S.C.section 360bbb-3(b)(1), unless the authorization is terminated  or revoked sooner.       Influenza A by PCR NEGATIVE NEGATIVE Final   Influenza B by PCR NEGATIVE NEGATIVE Final    Comment: (NOTE) The Xpert Xpress SARS-CoV-2/FLU/RSV plus assay is intended as an aid in the diagnosis of influenza from Nasopharyngeal swab specimens and should not be used as a sole basis for treatment. Nasal washings and aspirates are unacceptable for Xpert Xpress SARS-CoV-2/FLU/RSV testing.  Fact Sheet for Patients: BloggerCourse.com  Fact Sheet for Healthcare Providers: SeriousBroker.it  This test is not yet approved or cleared by the [place] FDA and has been authorized for detection and/or diagnosis of SARS-CoV-2 by FDA under an Emergency Use Authorization (EUA). This EUA will remain in effect (meaning this test can be used) for the duration of the COVID-19 declaration under Section 564(b)(1) of the Act, 21  U.S.C. section 360bbb-3(b)(1), unless the authorization is terminated or revoked.  Performed at [facility], [address] [place], [place] [address]          Radiology Studies: ECHOCARDIOGRAM COMPLETE  Result Date: 08/14/2021    ECHOCARDIOGRAM REPORT   Patient Name:   Donna Koch Date of Exam: 08/14/2021 Medical Rec #:  [id number]   Height:       60.0 in Accession #:    [id number]  Weight:       398.0 lb Date of Birth:  04-01-1966   BSA:          2.501 m Patient Age:    55 years    BP:           133/93 mmHg Patient Gender: F           HR:           76 bpm. Exam Location:  [facility] Procedure: 2D Echo, Cardiac Doppler and Color Doppler Indications:     CHF-acute diastolic I50.31  History:         Patient has no prior history of Echocardiogram examinations.                  Risk Factors:Diabetes.  Sonographer:     Cristela Blue Referring Phys:  [id number] Andris Baumann Diagnosing Phys: Lorine Bears MD  Sonographer Comments: No apical window, no subcostal window and Technically challenging study due to limited acoustic windows. IMPRESSIONS  1. Left ventricular ejection fraction, by estimation, is 20 to 25%. The left ventricle has severely decreased function. Left ventricular endocardial border not optimally defined to evaluate regional wall motion. The left ventricular internal cavity size  was moderately dilated. There is mild left ventricular hypertrophy. Left ventricular diastolic function could not be evaluated.  2. Right ventricular systolic function was not well visualized. The right ventricular size is normal. Tricuspid regurgitation signal is inadequate for assessing PA pressure.  3. Left atrial size was moderately dilated.  4. The mitral valve was not well visualized. No evidence of mitral valve regurgitation. No evidence of mitral stenosis.  5. The aortic valve was not well visualized. Aortic valve regurgitation is not visualized. Mild to moderate aortic valve sclerosis/calcification is  present, without any evidence of aortic stenosis.  6. Challenging image quality. FINDINGS  Left Ventricle: Left ventricular ejection fraction, by estimation, is 20 to 25%. The left ventricle has severely decreased function. Left ventricular endocardial border not optimally defined to evaluate regional wall motion. The left ventricular internal cavity size was moderately dilated. There is  mild left ventricular hypertrophy. Left ventricular diastolic function could not be evaluated. Right Ventricle: The right ventricular size is normal. No increase in right ventricular wall thickness. Right ventricular systolic function was not well visualized. Tricuspid regurgitation signal is inadequate for assessing PA pressure. Left Atrium: Left atrial size was moderately dilated. Right Atrium: Right atrial size was not well visualized. Pericardium: There is no evidence of pericardial effusion. Mitral Valve: The mitral valve was not well visualized. No evidence of mitral valve regurgitation. No evidence of mitral valve stenosis. Tricuspid Valve: The tricuspid valve is not well visualized. Tricuspid valve regurgitation is not demonstrated. No evidence of tricuspid stenosis. Aortic Valve: The aortic valve was not well visualized. Aortic valve regurgitation is not visualized. Mild to moderate aortic valve sclerosis/calcification is present, without any evidence of aortic stenosis. Pulmonic Valve: The pulmonic valve was not well visualized. Pulmonic valve regurgitation is not visualized. No evidence of pulmonic stenosis. Aorta: The aortic root is normal in size and structure. Venous: The inferior vena cava was not well visualized. IAS/Shunts: No atrial level shunt detected by color flow Doppler.  LEFT VENTRICLE PLAX 2D LVIDd:         4.58 cm LVIDs:         4.32 cm LV PW:         1.64 cm LV IVS:        1.21 cm LVOT diam:     2.00 cm LVOT Area:     3.14 cm  LEFT ATRIUM         Index LA diam:    4.80 cm 1.92 cm/m                         PULMONIC VALVE AORTA                 PV Vmax:        0.60 m/s Ao Root diam: 2.10 cm PV Peak grad:   1.4 mmHg                       RVOT Peak grad: 3 mmHg   SHUNTS Systemic Diam: 2.00 cm Lorine Bears MD Electronically signed by Lorine Bears MD Signature Date/Time: 08/14/2021/1:57:15 PM    Final         Scheduled Meds:  atorvastatin  20 mg Oral Daily   enoxaparin (LOVENOX) injection  0.5 mg/kg Subcutaneous Q24H   fluticasone  2 spray Each Nare Daily   furosemide  40 mg Intravenous BID   insulin aspart  0-20 Units Subcutaneous TID WC   insulin aspart  0-5 Units Subcutaneous QHS   insulin aspart  12 Units Subcutaneous TID WC   insulin NPH Human  20 Units Subcutaneous BID AC & HS   lidocaine  1 patch Transdermal Q24H   metoprolol tartrate  25 mg Oral BID   Continuous Infusions:   LOS: 3 days    Time spent: 35 mins     Charise Killian, MD [facility] Pager [phone]  If 7PM-7AM, please contact night-coverage 08/16/2021, 8:10 AM

## 2021-08-16 NOTE — Evaluation (Addendum)
Occupational Therapy Evaluation Patient Details Name: Donna Koch MRN: [id number] DOB: Sep 06, 1966 Today's Date: 08/16/2021   History of Present Illness Donna Koch is a 55 y.o. female with medical history significant for Diabetes, asthma who presents to the ED with a several day history of shortness of breath with exertion as well as lower extremity edema and weight gain.   Clinical Impression   Donna Koch was seen for OT/PT co-evaluation this date. Pt reports she is independent at baseline. She lives with her spouse and young children in a one level home with a ramped entrance. 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 She presents to OT services at or near baseline level of functional independence. Pt is able to perform bed/functional mobility independently in room. She completes toilet transfer independently. Pt requests assistance with peri-care 2/2 baseline pain which limits functional reach with RUE. Pt endorses regularly taking Tylenol at home for RUE pain and that pain is inflamed from pulling on bed rails this date. MOD A for peri-hygiene. All education and assessment completed at time of initial OT evaluation. No skilled needs identified. OT will sign off at this time. Do not anticipate the need for f/u OT services. Please re-consult if additional needs arise during this admission.      Recommendations for follow up therapy are one component of a multi-disciplinary discharge planning process, led by the attending physician.  Recommendations may be updated based on patient status, additional functional criteria and insurance authorization.   Follow Up Recommendations  No OT follow up    Equipment Recommendations  None recommended by OT    Recommendations for Other Services       Precautions / Restrictions Precautions Precautions: Fall Precaution Comments: Moderate Fall Restrictions Weight Bearing Restrictions: No      Mobility Bed  Mobility Overal bed mobility: Independent                  Transfers Overall transfer level: Independent Equipment used: None                  Balance Overall balance assessment: No apparent balance deficits (not formally assessed)                                         ADL either performed or assessed with clinical judgement   ADL Overall ADL's : At baseline                                       General ADL Comments: Pt at or near baseline level of functional independence for ADL management. She is able to perform bed/functional mobility in room independently. Requests assist with peri-care after toileting 2/2 R shoulder pain, but states she is able to functionally complete all tasks if needed/assist is not available. Pt presents with ROM for BUE WFL. Endorses R shoulder pain is chronic issue at home and she takes Tylenol regularly which controls pain and improves function.     Vision Baseline Vision/History: 1 Wears glasses Patient Visual Report: No change from baseline       Perception     Praxis      Pertinent Vitals/Pain Pain Assessment: Faces Faces Pain Scale: Hurts a little bit Pain Location: R shoulder Pain Descriptors / Indicators: Discomfort;Sore  Pain Intervention(s): Monitored during session;Limited activity within patient's tolerance;Repositioned     Hand Dominance Right   Extremity/Trunk Assessment Upper Extremity Assessment Upper Extremity Assessment: Overall WFL for tasks assessed (Pt c/o mild RUE discomfort "from pulling up on bed rails". GMC/FMC WNL, Strength WFL.)   Lower Extremity Assessment Lower Extremity Assessment: Defer to PT evaluation;Overall [facility] for tasks assessed   Cervical / Trunk Assessment Cervical / Trunk Assessment: Normal   Communication Communication Communication: No difficulties   Cognition Arousal/Alertness: Awake/alert Behavior During Therapy: WFL for tasks  assessed/performed Overall Cognitive Status: Within Functional Limits for tasks assessed                                 General Comments: Verbose   General Comments       Exercises     Shoulder Instructions      Home Living Family/patient expects to be discharged to:: Private residence Living Arrangements: Spouse/significant other;Children Available Help at Discharge: Family;Available 24 hours/day Type of Home: House Home Access: Ramped entrance     Home Layout: One level               Home Equipment: None          Prior Functioning/Environment Level of Independence: Independent        Comments: Pt is independent at baseline. 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        OT Problem List: Obesity;Decreased knowledge of use of DME or AE;Pain      OT Treatment/Interventions:      OT Goals(Current goals can be found in the care plan section) Acute Rehab OT Goals Patient Stated Goal: return home by tomorrow OT Goal Formulation: All assessment and education complete, DC therapy Time For Goal Achievement: 08/16/21 Potential to Achieve Goals: Good  OT Frequency:     Barriers to D/C:            Co-evaluation PT/OT/SLP Co-Evaluation/Treatment: Yes Reason for Co-Treatment: For patient/therapist safety;To address functional/ADL transfers PT goals addressed during session: Mobility/safety with mobility;Balance OT goals addressed during session: ADL's and self-care;Proper use of Adaptive equipment and DME      AM-PAC OT "6 Clicks" Daily Activity     Outcome Measure Help from another person eating meals?: None Help from another person taking care of personal grooming?: None Help from another person toileting, which includes using toliet, bedpan, or urinal?: A Little Help from another person bathing (including washing, rinsing, drying)?: None Help from another person to put on and taking off regular upper body clothing?: None Help  from another person to put on and taking off regular lower body clothing?: None 6 Click Score: 23   End of Session Equipment Utilized During Treatment:  [facility]) Nurse Communication: Mobility status  Activity Tolerance: Patient tolerated treatment well Patient left: in bed;with call bell/phone within reach  OT Visit Diagnosis: Other abnormalities of gait and mobility (R26.89);Pain Pain - Right/Left: Right Pain - part of body: Shoulder                Time: 1017-5102 OT Time Calculation (min): 22 min Charges:  OT General Charges $OT Visit: 1 Visit OT Evaluation $OT Eval Low Complexity: 1 Low  Rockney Ghee, M.S., OTR/L Ascom: [phone] 08/16/21, 9:58 AM

## 2021-08-16 NOTE — Progress Notes (Signed)
During bedside report, patient adamantly requested to leave AMA stating, "I am just tired of being in here. I keep getting numbness in my legs. I just want to go home. The doctor told me I can leave AMA anytime I wanted to".  Steward Drone NP and Toniann Fail CN made aware. Day shift RN confirmed with social worker that patient can pick up the medication in the morning. NP signed AMA form and so did patient,.

## 2021-08-16 NOTE — Consult Note (Signed)
   Heart Failure Nurse Navigator Note  HFrEF 20-25%, felt to be tachycardia mediated.  She presented to the emergency room with dyspnea, orthopnea and a 60 pound weight gain.  Comorbidities:  Morbidly obese Obstructive sleep apnea Type 2 diabetes Mild LVH A. fib with RVR  Medications:  Atorvastatin 20 mg daily Lasix 40 mg IV 2 times a day Toprol tartrate 25 mg 4 times a day  Labs:  Sodium 132, potassium 3.5, chloride 99, CO2 23, BUN 31, creatinine 1.01 Weight 180.3 kg yesterday 180.5 kg Intake 763 mL Output 2376 mL   Initial meeting with patient and husband who was at the bedside.  Asked them if they could tell me what her heart failure was and they described it as a decreased squeeze of her ventricle.  Discussed cares at home as far as daily weights and what to report, low-sodium diet and fluid restriction.  Removing the saltshaker from the table.  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  She claimed that she did not drink over 64 ounces in a 24-hour period..  Also discussed if she were to be compliant with her medications that the doctors have ordered for her she might see a return to normal of her ejection fraction.  Also discussed follow-up in the outpatient heart failure clinic with Clarisa Kindred NP, they claim that there might be conflict I asked that they call the number supplied and change the appointment to what fits them.  Appointment is currently set up for September 26 at 1230.  Was given the living with heart failure teaching booklet along with the packet and will continue to follow as long as she is in the hospital.  Tresa Endo RN [facility]

## 2021-08-17 ENCOUNTER — Other Ambulatory Visit: Payer: Self-pay

## 2021-08-17 NOTE — Discharge Summary (Addendum)
Physician Discharge Summary  Donna Koch MWU:[id number] DOB: 1966/09/05 DOA: 08/13/2021  PCP: Pcp, No  Admit date: 08/13/2021 Discharge date: 08/16/21  Admitted From:home  Disposition:  pt left AMA  Recommendations for Outpatient Follow-up:  Pt left AMA  Home Health: no  Equipment/Devices:  Discharge Condition:guarded CODE STATUS: Diet recommendation: Heart Healthy / Carb Modified  Brief/Interim Summary: HPI was taken from Dr. Para March: Donna Koch is a 55 y.o. female with medical history significant for Diabetes, asthma who presents to the ED with a several day history of shortness of breath with exertion as well as lower extremity edema and weight gain.  She denies chest pain.  Has no cough fever or chills.  Denies orthopnea or PND.  Has no one-sided lower extremity pain or swelling.  Denies palpitations or lightheadedness.  Has a remote history of CHF but does not currently on diuretics.  Is currently on antibiotics for sinusitis.   ED course: On arrival afebrile with BP 121/67 and heart rate in the 150s, O2 sat 96% on room air Blood work: Troponin 75 and BNP 212.  Mild creatinine elevation of 1.12 with no recent creatinine on record.  Blood sugar 320.  WBC normal at 9.8.  Hemoglobin 15.  COVID and flu negative   EKG, personally viewed and interpreted: Atrial fibrillation at 163 with no acute ST-T wave changes.  EKG post diltiazem bolus A. fib at 117   Imaging: Chest x-ray with cardiac enlargement no pulmonary vascular congestion or consolidation   Patient started on diltiazem infusion.  Given IV Lasix.  Hospitalist consulted for admission.  As per Dr. Alanda Slim: 55 year old F with PMH of DM-2, systolic CHF, morbid obesity, asthma, GIB and "recurrent sinusitis" presenting with shortness of breath, lower extremity edema and increased weight gain, and admitted for A. fib with RVR and systolic CHF exacerbation.  Started on IV Lasix and Cardizem drip.  Cardiology consulted.   TTE with LVEF  of 20 to 25% (previously 40 to 45%), not able to assess RWMA or DD.  Cardiology managing.  As per Dr. Mayford Knife: Pt was still in a. fib w/ RVR on the day the pt decided to leave AMA. Pt declined starting anticoagulation and entresto as per cardio. Pt stated she wanted to go home early in the morning and then decided to leave AMA that evening.     Discharge Diagnoses:  Principal Problem:   Atrial fibrillation with RVR, new onset (HCC) Active Problems:   Hyperglycemia due to type 2 diabetes mellitus (HCC)   Acute CHF (congestive heart failure) (HCC)   Morbid obesity with BMI of 70 and over, adult (HCC)   Sinusitis   Elevated troponin   Rapid atrial fibrillation (HCC)   Asthma   Acute HFrEF (heart failure with reduced ejection fraction) (HCC)  A. fib: w/ RVR. New onset. TTE with LVEF of 20 to 25% (previously 40 to 45%), not able to assess RWMA or DD. Cardizem drip was d/c. Continue on metoprolol. Patient persistently refused anticoagulation despite risk-benefit discussion  Acute on chronic systolic CHF: TTE as above. Continue on IV lasix. Monitor I/Os and daily weights. Admits to not watching how much fluid she intakes    Elevated troponins: likely demand ischemia from above.     DM2: poorly controlled w/ HbA1c 9.2%. Continue on novolog TID, SSI w/ accuchecks   HTN: continue on metoprolol   Recurrent sinusitis: as per pt. Reportedly treated with doxycycline without improvement and transition to clarithromycin.  No clinical signs of bacterial sinusitis on  exam.     Mild intermittent asthma: continue on bronchodilators   Noncompliance/low health literacy: very challenging. Received counseling    Morbid obesity: BMI 72.8. Complicates overall care and prognosis  Discharge Instructions  Discharge Instructions     Amb referral to [facility]   Complete by: As directed       Allergies as of 08/16/2021       Reactions   Aspirin Shortness Of Breath   Pt with asthma, aspirin  allergy, nasal polyps   Cephalosporins Rash   Avoid because of reaction to penicillins    Nsaids Anaphylaxis   Penicillins Anaphylaxis   Ace Inhibitors    Other reaction(s): Cough, Other (See Comments) Other Reaction: WHEEZE   Levofloxacin Nausea Only   Oxycodone    Other reaction(s): Other (See Comments) Other Reaction: N/V/DIZZY/WHEEZE        Medication List     ASK your doctor about these medications    albuterol 108 (90 Base) MCG/ACT inhaler Commonly known as: VENTOLIN HFA Inhale 1-2 puffs into the lungs every 6 (six) hours as needed.   calcium-vitamin D 500-200 MG-UNIT tablet Commonly known as: OSCAL WITH D Take 1 tablet by mouth daily.   Cetirizine HCl 10 MG Caps Take 1 tablet by mouth daily.   clarithromycin 500 MG tablet Commonly known as: BIAXIN Take 500 mg by mouth in the morning and at bedtime. For 10 days   diphenhydrAMINE 25 mg capsule Commonly known as: BENADRYL Take 50 mg by mouth at bedtime as needed.   furosemide 20 MG tablet Commonly known as: LASIX Take 20 mg by mouth daily.   insulin NPH Human 100 UNIT/ML injection Commonly known as: NOVOLIN N Inject 45 Units into the skin at bedtime.   insulin regular 100 units/mL injection Commonly known as: NOVOLIN R Inject 65 Units into the skin at bedtime.   losartan 50 MG tablet Commonly known as: COZAAR Take 50 mg by mouth daily.   MULTIVITAMINS PO Take 1 tablet by mouth daily.   NOVOLIN 70/30 Sebastian Inject 45-75 Units into the skin in the morning.        Allergies  Allergen Reactions   Aspirin Shortness Of Breath    Pt with asthma, aspirin allergy, nasal polyps   Cephalosporins Rash    Avoid because of reaction to penicillins    Nsaids Anaphylaxis   Penicillins Anaphylaxis   Ace Inhibitors     Other reaction(s): Cough, Other (See Comments) Other Reaction: WHEEZE   Levofloxacin Nausea Only   Oxycodone     Other reaction(s): Other (See Comments) Other Reaction: N/V/DIZZY/WHEEZE     Consultations: Cardio,    Procedures/Studies: US Venous Img Lower Bilateral (DVT)  Result Date: 08/13/2021 CLINICAL DATA:  Swelling for years.  Redness in lower leg. EXAM: BILATERAL LOWER EXTREMITY VENOUS DOPPLER ULTRASOUND TECHNIQUE: Gray-scale sonography with compression, as well as color and duplex ultrasound, were performed to evaluate the deep venous system(s) from the level of the common femoral vein through the popliteal and proximal calf veins. COMPARISON:  None. FINDINGS: VENOUS Normal compressibility of the common femoral, superficial femoral, and popliteal veins, as well as the visualized calf veins. Visualized portions of profunda femoral vein and great saphenous vein unremarkable. No filling defects to suggest DVT on grayscale or color Doppler imaging. Doppler waveforms show normal direction of venous flow, normal respiratory plasticity and response to augmentation. Limited views of the contralateral common femoral vein are unremarkable. OTHER None. Limitations: none IMPRESSION: Study is limited due to patient  body habitus. Within visualized limits, no DVT identified. Electronically Signed   By: Gerome Sam III M.D.   On: 08/13/2021 15:29   DG Chest Portable 1 View  Result Date: 08/13/2021 CLINICAL DATA:  Shortness of breath with exertion EXAM: PORTABLE CHEST 1 VIEW COMPARISON:  02/21/2017 FINDINGS: Cardiac enlargement. No edema or consolidation. No pleural effusions. No pneumothorax. Mediastinal contours appear intact. IMPRESSION: Cardiac enlargement.  No active pulmonary disease. Electronically Signed   By: Burman Nieves M.D.   On: 08/13/2021 00:51   ECHOCARDIOGRAM COMPLETE  Result Date: 08/14/2021    ECHOCARDIOGRAM REPORT   Patient Name:   SHEARON CLONCH Date of Exam: 08/14/2021 Medical Rec #:  [id number]   Height:       60.0 in Accession #:    [id number]  Weight:       398.0 lb Date of Birth:  08-04-1966   BSA:          2.501 m Patient Age:    55 years    BP:           133/93  mmHg Patient Gender: F           HR:           76 bpm. Exam Location:  [facility] Procedure: 2D Echo, Cardiac Doppler and Color Doppler Indications:     CHF-acute diastolic I50.31  History:         Patient has no prior history of Echocardiogram examinations.                  Risk Factors:Diabetes.  Sonographer:     Cristela Blue Referring Phys:  [id number] Andris Baumann Diagnosing Phys: Lorine Bears MD  Sonographer Comments: No apical window, no subcostal window and Technically challenging study due to limited acoustic windows. IMPRESSIONS  1. Left ventricular ejection fraction, by estimation, is 20 to 25%. The left ventricle has severely decreased function. Left ventricular endocardial border not optimally defined to evaluate regional wall motion. The left ventricular internal cavity size  was moderately dilated. There is mild left ventricular hypertrophy. Left ventricular diastolic function could not be evaluated.  2. Right ventricular systolic function was not well visualized. The right ventricular size is normal. Tricuspid regurgitation signal is inadequate for assessing PA pressure.  3. Left atrial size was moderately dilated.  4. The mitral valve was not well visualized. No evidence of mitral valve regurgitation. No evidence of mitral stenosis.  5. The aortic valve was not well visualized. Aortic valve regurgitation is not visualized. Mild to moderate aortic valve sclerosis/calcification is present, without any evidence of aortic stenosis.  6. Challenging image quality. FINDINGS  Left Ventricle: Left ventricular ejection fraction, by estimation, is 20 to 25%. The left ventricle has severely decreased function. Left ventricular endocardial border not optimally defined to evaluate regional wall motion. The left ventricular internal cavity size was moderately dilated. There is mild left ventricular hypertrophy. Left ventricular diastolic function could not be evaluated. Right Ventricle: The right ventricular size is  normal. No increase in right ventricular wall thickness. Right ventricular systolic function was not well visualized. Tricuspid regurgitation signal is inadequate for assessing PA pressure. Left Atrium: Left atrial size was moderately dilated. Right Atrium: Right atrial size was not well visualized. Pericardium: There is no evidence of pericardial effusion. Mitral Valve: The mitral valve was not well visualized. No evidence of mitral valve regurgitation. No evidence of mitral valve stenosis. Tricuspid Valve: The tricuspid valve is not well visualized. Tricuspid valve regurgitation is  not demonstrated. No evidence of tricuspid stenosis. Aortic Valve: The aortic valve was not well visualized. Aortic valve regurgitation is not visualized. Mild to moderate aortic valve sclerosis/calcification is present, without any evidence of aortic stenosis. Pulmonic Valve: The pulmonic valve was not well visualized. Pulmonic valve regurgitation is not visualized. No evidence of pulmonic stenosis. Aorta: The aortic root is normal in size and structure. Venous: The inferior vena cava was not well visualized. IAS/Shunts: No atrial level shunt detected by color flow Doppler.  LEFT VENTRICLE PLAX 2D LVIDd:         4.58 cm LVIDs:         4.32 cm LV PW:         1.64 cm LV IVS:        1.21 cm LVOT diam:     2.00 cm LVOT Area:     3.14 cm  LEFT ATRIUM         Index LA diam:    4.80 cm 1.92 cm/m                        PULMONIC VALVE AORTA                 PV Vmax:        0.60 m/s Ao Root diam: 2.10 cm PV Peak grad:   1.4 mmHg                       RVOT Peak grad: 3 mmHg   SHUNTS Systemic Diam: 2.00 cm Lorine Bears MD Electronically signed by Lorine Bears MD Signature Date/Time: 08/14/2021/1:57:15 PM    Final    (Echo, Carotid, EGD, Colonoscopy, ERCP)    Subjective: Pt c/o wanting to go home    Discharge Exam: Vitals:   08/16/21 1630 08/16/21 2032  BP: 127/80 124/70  Pulse: 69 79  Resp: 18 20  Temp: 98.1 F (36.7 C) 97.9 F  (36.6 C)  SpO2: 97% 97%   Vitals:   08/16/21 0758 08/16/21 1222 08/16/21 1630 08/16/21 2032  BP: (!) 115/91 128/86 127/80 124/70  Pulse: 64 69 69 79  Resp: 18 18 18 20   Temp: 97.6 F (36.4 C) 97.6 F (36.4 C) 98.1 F (36.7 C) 97.9 F (36.6 C)  TempSrc: Oral Oral Oral Oral  SpO2: 99% 98% 97% 97%  Weight:      Height:        General exam: Appears calm and comfortable. Morbidly obese   Respiratory system: diminished breath sounds b/l Cardiovascular system: irregularly irregular. No  rubs, gallops or clicks.  Gastrointestinal system: Abdomen is obese, soft and nontender. Normal bowel sounds heard. Central nervous system: Alert and oriented. Moves all extremities Psychiatry: Judgement and insight appear normal. Mood & affect appropriate.    The results of significant diagnostics from this hospitalization (including imaging, microbiology, ancillary and laboratory) are listed below for reference.     Microbiology: Recent Results (from the past 240 hour(s))  Resp Panel by RT-PCR (Flu A&B, Covid) Nasopharyngeal Swab     Status: None   Collection Time: 08/13/21 12:32 AM   Specimen: Nasopharyngeal Swab; Nasopharyngeal(NP) swabs in vial transport medium  Result Value Ref Range Status   SARS Coronavirus 2 by RT PCR NEGATIVE NEGATIVE Final    Comment: (NOTE) SARS-CoV-2 target nucleic acids are NOT DETECTED.  The SARS-CoV-2 RNA is generally detectable in upper respiratory specimens during the acute phase of infection. The lowest concentration of SARS-CoV-2 viral  copies this assay can detect is 138 copies/mL. A negative result does not preclude SARS-Cov-2 infection and should not be used as the sole basis for treatment or other patient management decisions. A negative result may occur with  improper specimen collection/handling, submission of specimen other than nasopharyngeal swab, presence of viral mutation(s) within the areas targeted by this assay, and inadequate number of  viral copies(<138 copies/mL). A negative result must be combined with clinical observations, patient history, and epidemiological information. The expected result is Negative.  Fact Sheet for Patients:  BloggerCourse.com  Fact Sheet for Healthcare Providers:  SeriousBroker.it  This test is no t yet approved or cleared by the [place] FDA and  has been authorized for detection and/or diagnosis of SARS-CoV-2 by FDA under an Emergency Use Authorization (EUA). This EUA will remain  in effect (meaning this test can be used) for the duration of the COVID-19 declaration under Section 564(b)(1) of the Act, 21 U.S.C.section 360bbb-3(b)(1), unless the authorization is terminated  or revoked sooner.       Influenza A by PCR NEGATIVE NEGATIVE Final   Influenza B by PCR NEGATIVE NEGATIVE Final    Comment: (NOTE) The Xpert Xpress SARS-CoV-2/FLU/RSV plus assay is intended as an aid in the diagnosis of influenza from Nasopharyngeal swab specimens and should not be used as a sole basis for treatment. Nasal washings and aspirates are unacceptable for Xpert Xpress SARS-CoV-2/FLU/RSV testing.  Fact Sheet for Patients: BloggerCourse.com  Fact Sheet for Healthcare Providers: SeriousBroker.it  This test is not yet approved or cleared by the [place] FDA and has been authorized for detection and/or diagnosis of SARS-CoV-2 by FDA under an Emergency Use Authorization (EUA). This EUA will remain in effect (meaning this test can be used) for the duration of the COVID-19 declaration under Section 564(b)(1) of the Act, 21 U.S.C. section 360bbb-3(b)(1), unless the authorization is terminated or revoked.  Performed at [facility], [address] [place], [place] [address]      Labs: BNP (last 3 results) Recent Labs    08/13/21 0032  BNP 212.2*   Basic Metabolic  Panel: Recent Labs  Lab 08/13/21 0032 08/14/21 0657 08/15/21 0449 08/16/21 0405  NA 131* 130* 132* 132*  K 3.8 4.2 3.5 3.8  CL 99 99 101 99  CO2 23 21* 24 23  GLUCOSE 320* 400* 201* 98  BUN 29* 34* 36* 31*  CREATININE 1.12* 1.34* 1.10* 1.01*  CALCIUM 8.4* 8.0* 8.2* 8.5*  MG 2.4 2.1 2.2 2.3  PHOS  --  3.3 3.2 3.1   Liver Function Tests: Recent Labs  Lab 08/14/21 0657 08/15/21 0449 08/16/21 0405  ALBUMIN 3.1* 3.3* 3.3*   No results for input(s): LIPASE, AMYLASE in the last 168 hours. No results for input(s): AMMONIA in the last 168 hours. CBC: Recent Labs  Lab 08/13/21 0032 08/14/21 0657 08/15/21 0449 08/16/21 0405  WBC 9.8 11.3* 9.9 8.4  NEUTROABS 6.5  --   --   --   HGB 15.8* 14.5 14.4 15.2*  HCT 46.6* 43.9 41.3 46.4*  MCV 91.4 90.0 89.8 89.6  PLT 294 278 277 293   Cardiac Enzymes: No results for input(s): CKTOTAL, CKMB, CKMBINDEX, TROPONINI in the last 168 hours. BNP: Invalid input(s): POCBNP CBG: Recent Labs  Lab 08/15/21 2107 08/16/21 0801 08/16/21 1057 08/16/21 1223 08/16/21 1541  GLUCAP 189* 105* 214* 220* 243*   D-Dimer No results for input(s): DDIMER in the last 72 hours. Hgb A1c No results for input(s): HGBA1C in the  last 72 hours. Lipid Profile No results for input(s): CHOL, HDL, LDLCALC, TRIG, CHOLHDL, LDLDIRECT in the last 72 hours. Thyroid function studies No results for input(s): TSH, T4TOTAL, T3FREE, THYROIDAB in the last 72 hours.  Invalid input(s): FREET3 Anemia work up No results for input(s): VITAMINB12, FOLATE, FERRITIN, TIBC, IRON, RETICCTPCT in the last 72 hours. Urinalysis No results found for: COLORURINE, APPEARANCEUR, LABSPEC, PHURINE, GLUCOSEU, HGBUR, BILIRUBINUR, KETONESUR, PROTEINUR, UROBILINOGEN, NITRITE, LEUKOCYTESUR Sepsis Labs Invalid input(s): PROCALCITONIN,  WBC,  LACTICIDVEN Microbiology Recent Results (from the past 240 hour(s))  Resp Panel by RT-PCR (Flu A&B, Covid) Nasopharyngeal Swab     Status: None    Collection Time: 08/13/21 12:32 AM   Specimen: Nasopharyngeal Swab; Nasopharyngeal(NP) swabs in vial transport medium  Result Value Ref Range Status   SARS Coronavirus 2 by RT PCR NEGATIVE NEGATIVE Final    Comment: (NOTE) SARS-CoV-2 target nucleic acids are NOT DETECTED.  The SARS-CoV-2 RNA is generally detectable in upper respiratory specimens during the acute phase of infection. The lowest concentration of SARS-CoV-2 viral copies this assay can detect is 138 copies/mL. A negative result does not preclude SARS-Cov-2 infection and should not be used as the sole basis for treatment or other patient management decisions. A negative result may occur with  improper specimen collection/handling, submission of specimen other than nasopharyngeal swab, presence of viral mutation(s) within the areas targeted by this assay, and inadequate number of viral copies(<138 copies/mL). A negative result must be combined with clinical observations, patient history, and epidemiological information. The expected result is Negative.  Fact Sheet for Patients:  BloggerCourse.com  Fact Sheet for Healthcare Providers:  SeriousBroker.it  This test is no t yet approved or cleared by the [place] FDA and  has been authorized for detection and/or diagnosis of SARS-CoV-2 by FDA under an Emergency Use Authorization (EUA). This EUA will remain  in effect (meaning this test can be used) for the duration of the COVID-19 declaration under Section 564(b)(1) of the Act, 21 U.S.C.section 360bbb-3(b)(1), unless the authorization is terminated  or revoked sooner.       Influenza A by PCR NEGATIVE NEGATIVE Final   Influenza B by PCR NEGATIVE NEGATIVE Final    Comment: (NOTE) The Xpert Xpress SARS-CoV-2/FLU/RSV plus assay is intended as an aid in the diagnosis of influenza from Nasopharyngeal swab specimens and should not be used as a sole basis for treatment.  Nasal washings and aspirates are unacceptable for Xpert Xpress SARS-CoV-2/FLU/RSV testing.  Fact Sheet for Patients: BloggerCourse.com  Fact Sheet for Healthcare Providers: SeriousBroker.it  This test is not yet approved or cleared by the [place] FDA and has been authorized for detection and/or diagnosis of SARS-CoV-2 by FDA under an Emergency Use Authorization (EUA). This EUA will remain in effect (meaning this test can be used) for the duration of the COVID-19 declaration under Section 564(b)(1) of the Act, 21 U.S.C. section 360bbb-3(b)(1), unless the authorization is terminated or revoked.  Performed at [facility], [address] [place], [place] [address]      Time coordinating discharge: Over 30 minutes  SIGNED:   Charise Killian, MD  [facility] 08/17/2021, 4:02 PM Pager   If 7PM-7AM, please contact night-coverage

## 2021-08-17 NOTE — Progress Notes (Signed)
Cross Cover Patient decided to leave against mefdical advise 08/16/2021 despite education from myself and her nurse regarding rsks and inability to provide meds to her to safely go home last night.  She was very agitated and upset and did not understand why meds could not be given to her tonight

## 2021-08-21 ENCOUNTER — Other Ambulatory Visit: Payer: Self-pay

## 2021-08-21 ENCOUNTER — Ambulatory Visit: Payer: Medicaid Other | Attending: Family | Admitting: Family

## 2021-08-21 ENCOUNTER — Encounter: Payer: Self-pay | Admitting: Family

## 2021-08-21 VITALS — BP 108/75 | HR 54 | Resp 18 | Ht 63.0 in | Wt >= 6400 oz

## 2021-08-21 DIAGNOSIS — J45909 Unspecified asthma, uncomplicated: Secondary | ICD-10-CM | POA: Insufficient documentation

## 2021-08-21 DIAGNOSIS — Z881 Allergy status to other antibiotic agents status: Secondary | ICD-10-CM | POA: Insufficient documentation

## 2021-08-21 DIAGNOSIS — I5022 Chronic systolic (congestive) heart failure: Secondary | ICD-10-CM | POA: Insufficient documentation

## 2021-08-21 DIAGNOSIS — Z880 Allergy status to penicillin: Secondary | ICD-10-CM | POA: Insufficient documentation

## 2021-08-21 DIAGNOSIS — I480 Paroxysmal atrial fibrillation: Secondary | ICD-10-CM

## 2021-08-21 DIAGNOSIS — Z79899 Other long term (current) drug therapy: Secondary | ICD-10-CM | POA: Insufficient documentation

## 2021-08-21 DIAGNOSIS — E119 Type 2 diabetes mellitus without complications: Secondary | ICD-10-CM | POA: Insufficient documentation

## 2021-08-21 DIAGNOSIS — Z886 Allergy status to analgesic agent status: Secondary | ICD-10-CM | POA: Insufficient documentation

## 2021-08-21 DIAGNOSIS — I10 Essential (primary) hypertension: Secondary | ICD-10-CM

## 2021-08-21 DIAGNOSIS — I110 Hypertensive heart disease with heart failure: Secondary | ICD-10-CM | POA: Insufficient documentation

## 2021-08-21 DIAGNOSIS — Z885 Allergy status to narcotic agent status: Secondary | ICD-10-CM | POA: Insufficient documentation

## 2021-08-21 DIAGNOSIS — Z794 Long term (current) use of insulin: Secondary | ICD-10-CM | POA: Insufficient documentation

## 2021-08-21 DIAGNOSIS — I4891 Unspecified atrial fibrillation: Secondary | ICD-10-CM | POA: Insufficient documentation

## 2021-08-21 MED ORDER — LOSARTAN POTASSIUM 50 MG PO TABS
50.0000 mg | ORAL_TABLET | Freq: Every day | ORAL | 3 refills | Status: DC
  Filled 2021-08-21: qty 30, 30d supply, fill #0

## 2021-08-21 MED ORDER — FUROSEMIDE 20 MG PO TABS
40.0000 mg | ORAL_TABLET | Freq: Every day | ORAL | 3 refills | Status: DC
  Filled 2021-08-21: qty 60, 30d supply, fill #0

## 2021-08-21 NOTE — Progress Notes (Signed)
Patient ID: Donna Koch, female    DOB: 03-12-1966, 55 y.o.   MRN: [id number]   Donna Koch is a 55 year old female patient with a PMHx of type II DM, asthma, atrial fibrillation, morbid obesity, chronic sinusitis and chronic heart failure.   Echo 08/14/21 Left ventricular ejection fraction, by estimation, is 20 to 25%. The left ventricle has severely decreased function. Left ventricular endocardial border not optimally defined to evaluate regional wall motion. The left ventricular internal cavity size was moderately dilated. There is mild left ventricular hypertrophy. Left ventricular diastolic function could not be evaluated.   She was recently admitted to [facility] 9/19-21 with new onset Afib with RVR, HFrEF. She ultimately left AMA prior to medical optimization.   She reports to the clinic for her initial visit with a chief complaint of her newly diagnosed heart failure. She reports fatigue, bilateral pedal edema, SOB with exertion and describes these symptoms as having been present for several months.  She denies any CP, palpitations, dizziness or syncope.   She does weigh herself at home and reports it has been 340-390. Today, her weight is 401 in the office.  Past Medical History:  Diagnosis Date   Asthma    Atrial fibrillation (HCC)    CHF (congestive heart failure) (HCC)    Diabetes mellitus without complication (HCC)    Past Surgical History:  Procedure Laterality Date   CESAREAN SECTION     NASAL SINUS SURGERY     TONSILLECTOMY     History reviewed. No pertinent family history. Social History   Tobacco Use   Smoking status: Never   Smokeless tobacco: Never  Substance Use Topics   Alcohol use: Not Currently   Allergies  Allergen Reactions   Aspirin Shortness Of Breath    Pt with asthma, aspirin allergy, nasal polyps   Cephalosporins Rash    Avoid because of reaction to penicillins    Nsaids Anaphylaxis   Penicillins Anaphylaxis   Ace Inhibitors     Other reaction(s):  Cough, Other (See Comments) Other Reaction: WHEEZE   Levofloxacin Nausea Only   Oxycodone     Other reaction(s): Other (See Comments) Other Reaction: N/V/DIZZY/WHEEZE   Prior to Admission medications   Medication Sig Start Date End Date Taking? Authorizing Provider  acetaminophen (TYLENOL) 500 MG tablet Take 500 mg by mouth in the morning and at bedtime.   Yes [provider]  albuterol (VENTOLIN HFA) 108 (90 Base) MCG/ACT inhaler Inhale 1-2 puffs into the lungs every 6 (six) hours as needed. 04/24/18  Yes [provider]  calcium-vitamin D (OSCAL WITH D) 500-200 MG-UNIT tablet Take 1 tablet by mouth daily.   Yes [provider]  Cetirizine HCl 10 MG CAPS Take 1 tablet by mouth daily.   Yes [provider]  diphenhydrAMINE (BENADRYL) 25 mg capsule Take 50 mg by mouth at bedtime as needed.   Yes [provider]  insulin NPH Human (NOVOLIN N) 100 UNIT/ML injection Inject 45 Units into the skin at bedtime. 10/07/19  Yes [provider]  Insulin NPH Isophane & Regular (NOVOLIN 70/30 Hatch) Inject 45-75 Units into the skin in the morning.   Yes [provider]  insulin regular (NOVOLIN R) 100 units/mL injection Inject 65 Units into the skin at bedtime. 04/24/18  Yes [provider]  Multiple Vitamin (MULTIVITAMINS PO) Take 1 tablet by mouth daily. 01/07/07  Yes [provider]  furosemide (LASIX) 20 MG tablet Take 1 tablet (20 mg total) by  mouth daily. 08/21/21   Delma Freeze, FNP  losartan (COZAAR) 50 MG tablet Take 1 tablet (50 mg total) by mouth daily. 08/21/21   Delma Freeze, FNP     Review of Systems  Constitutional:  Positive for fatigue and unexpected weight change (weight fluctuates 40 lbs). Negative for appetite change.  HENT:  Positive for congestion (chronic sinusitis), ear pain, sinus pressure and sinus pain.   Eyes: Negative.   Respiratory:  Positive for shortness of breath (with minimal exertion).  Negative for apnea, cough, chest tightness and wheezing.   Cardiovascular:  Positive for leg swelling. Negative for chest pain and palpitations.  Gastrointestinal:  Positive for abdominal distention. Negative for abdominal pain.  Endocrine: Negative.   Genitourinary: Negative.   Musculoskeletal:  Positive for arthralgias and back pain.  Skin: Negative.   Neurological:  Negative for dizziness, weakness, light-headedness and headaches.  Hematological:  Negative for adenopathy.  Psychiatric/Behavioral:  Negative for dysphoric mood and sleep disturbance. The patient is not nervous/anxious.    Vitals:   08/21/21 1238  BP: 108/75  Pulse: (!) 54  Resp: 18  SpO2: 98%  Weight: (!) 401 lb 8 oz (182.1 kg)  Height: 5\' 3"  (1.6 m)   Wt Readings from Last 3 Encounters:  08/21/21 (!) 401 lb 8 oz (182.1 kg)  08/16/21 (!) 397 lb 7.8 oz (180.3 kg)   Lab Results  Component Value Date   CREATININE 1.01 (H) 08/16/2021   CREATININE 1.10 (H) 08/15/2021   CREATININE 1.34 (H) 08/14/2021     Physical Exam Vitals and nursing note reviewed. Exam conducted with a chaperone present.  Constitutional:      Appearance: She is obese.  Neck:     Comments: - JVD + achanthosis nigricans Cardiovascular:     Rate and Rhythm: Tachycardia present. Rhythm irregular.     Comments: Irregularly irregular and tachycardic Pulmonary:     Effort: No respiratory distress.     Breath sounds: No wheezing, rhonchi or rales.  Abdominal:     General: There is distension (lower panus firm to palpation).  Musculoskeletal:        General: Swelling present.     Right lower leg: Edema present.     Left lower leg: Edema present.  Skin:    General: Skin is warm and dry.  Neurological:     Mental Status: She is alert and oriented to person, place, and time.   Assessment and Plan:  Heart failure with reduced ejection fraction, 20-25% - NYHA class III - euvolemic today - Patient is hesitant to start any medical modalities  to treat and manage her heart failure - advises that she eats a diet of mainly fresh fruits, vegetables and smoothies - she avoids adding salt or canned foods - she weighs her self sporadically and her weight can fluctuate around 40 lbs - she is on losartan and has been for 15 years - lasix 20 mg QD, she would like to increase this dosage as she felt better when they were initially diuresing her in the hospital, she is out of her current prescription, will place refill order - allergic to ACE, had a "bad reaction" to metoprolol - BNP 212 on 08/13/21  2. Atrial Fibrillation - pt denies palpitations - endorses worsening fatigue - she refused anticoagulation in the hospital - explicitly discussed risk of afib without anticoagulation in place including stroke and death - tachycardic and irregularly irregular - appointment facilitated for cardiology f/u appt on 08/25/21  3.  HTN - BP today 108/75 - currently without a PCP - on losartan but needs prescription refill, will place refill   4. DM - A1c 9.2% on 08/13/21 - Currently without a PCP, has been going to [employer] and buying insulin - advised her to get established with a PCP, she is with [facility]   Encouraged her to bring her medications with her to each visit  Return in 1 month.

## 2021-08-21 NOTE — Patient Instructions (Addendum)
Continue weighing daily and call for an overnight weight gain of > 2 pounds or a weekly weight gain of >5 pounds.    Increase furosemide to 40mg  daily. If your blood pressure decreases to <100/60, decrease furosemide to 20mg  daily.    Call your doctor if BP gets >170/ 90

## 2021-08-25 ENCOUNTER — Ambulatory Visit: Payer: Medicaid Other | Admitting: Medical

## 2021-09-20 ENCOUNTER — Ambulatory Visit: Payer: Medicaid Other | Admitting: Family

## 2021-12-29 ENCOUNTER — Telehealth: Payer: Self-pay | Admitting: Pharmacist

## 2021-12-29 NOTE — Telephone Encounter (Signed)
Patient failed to provide requested 2023 financial documentation. No additional medication assistance will be provided by [facility] without the required proof of income documentation. Patient notified by letter. ? ?Deborah Barnes ?Medication Management ?

## 2022-01-03 ENCOUNTER — Other Ambulatory Visit: Payer: Self-pay

## 2022-01-12 ENCOUNTER — Telehealth: Payer: Self-pay | Admitting: Pharmacy Technician

## 2022-01-12 NOTE — Telephone Encounter (Signed)
Patient failed to provide 2023 proof of income.  No additional medication assistance will be provided by [facility] without the required proof of income documentation.  Patient notified by letter.  Donna Koch Care Manager [facility]   [name], [place]  [address]  December 29, 2021    Dear Donna Koch:  This is to inform you that you are no longer eligible to receive medication assistance at [facility].  The reason(s) are:    _____Your total gross monthly household income exceeds 300% of the Federal Poverty Level.   _____Tangible assets (savings, checking, stocks/bonds, pension, retirement, etc.) exceeds our limit  _____You are eligible to receive benefits from [facility], [facility] or HIV Medication              Assistance Program _____You are eligible to receive benefits from a Medicare Part D plan _____You have prescription insurance  _____You are not an [facility] resident __X__Failure to provide all requested documentation (proof of income for 2023, and/or Patient Intake Application, DOH Attestation, Contract, etc).    Medication assistance will resume once all requested documentation has been returned to our clinic.  If you have questions, please contact our clinic at [phone].    Thank you,  [facility]

## 2022-08-28 IMAGING — US US EXTREM LOW VENOUS
1 series · 14 of 24 positions shown · non-contrast
Comparison: None.

CLINICAL DATA: Swelling for years.  Redness in lower leg.

EXAM:
BILATERAL LOWER EXTREMITY VENOUS DOPPLER ULTRASOUND
TECHNIQUE: Gray-scale sonography with compression, as well as color and duplex
ultrasound, were performed to evaluate the deep venous system(s)
from the level of the common femoral vein through the popliteal and
proximal calf veins.

[Series 1: us venous img lower bilat (dvt) · portal-venous · 14 of 60 slices shown]
[im 1/60]
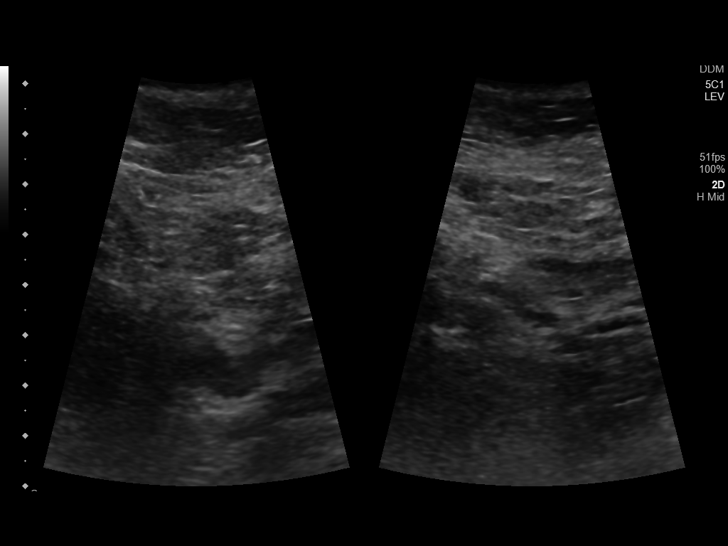
[im 6/60]
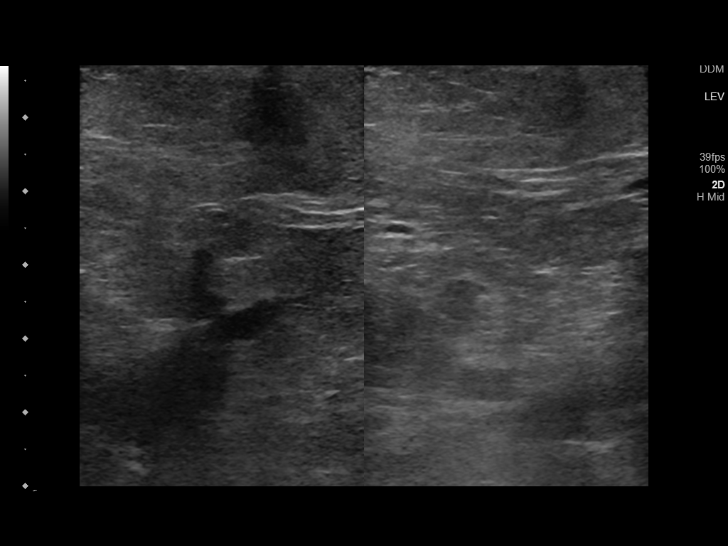
[im 11/60]
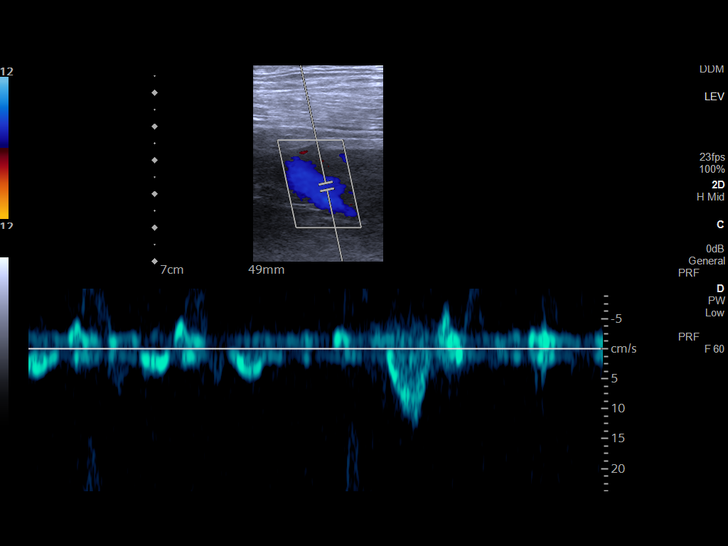
[im 16/60]
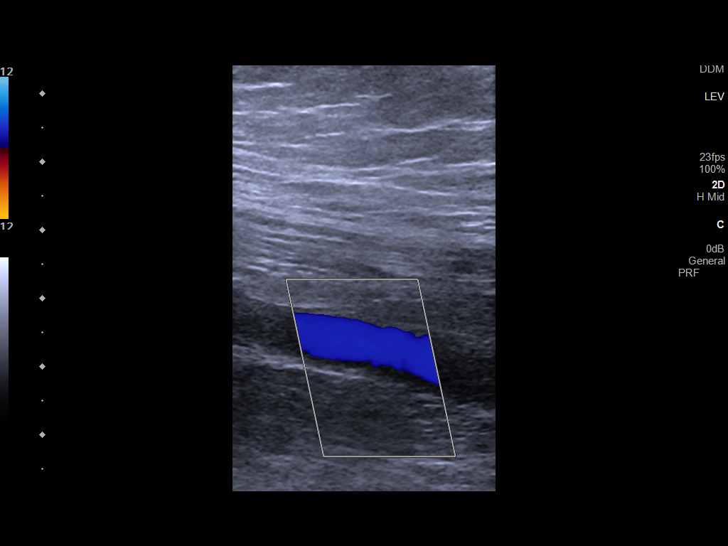
[im 18/60]
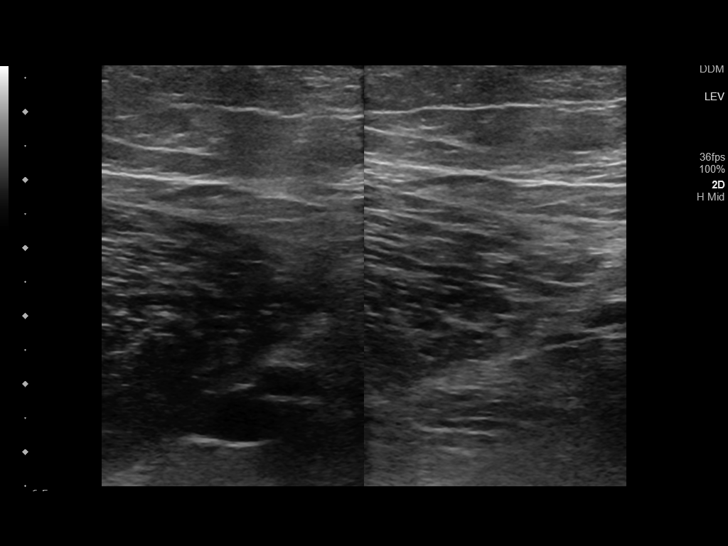
[im 24/60]
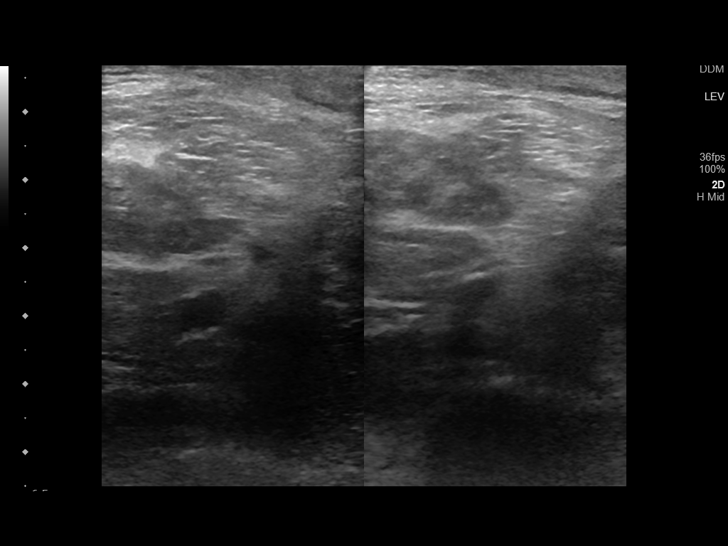
[im 29/60]
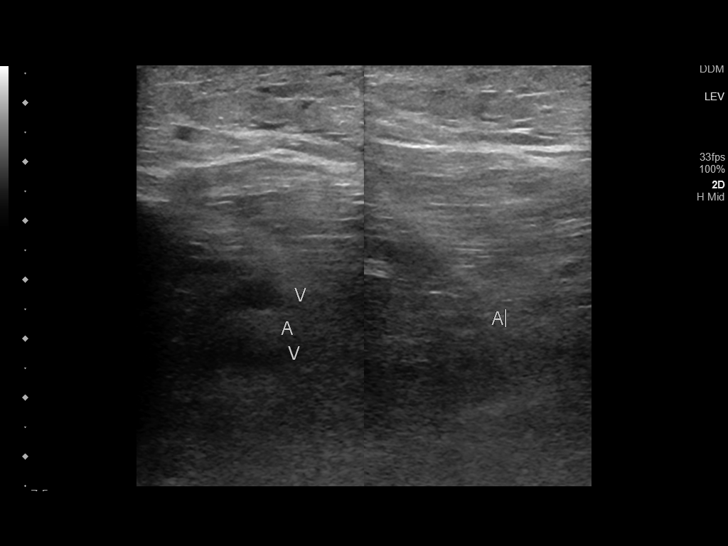
[im 31/60]
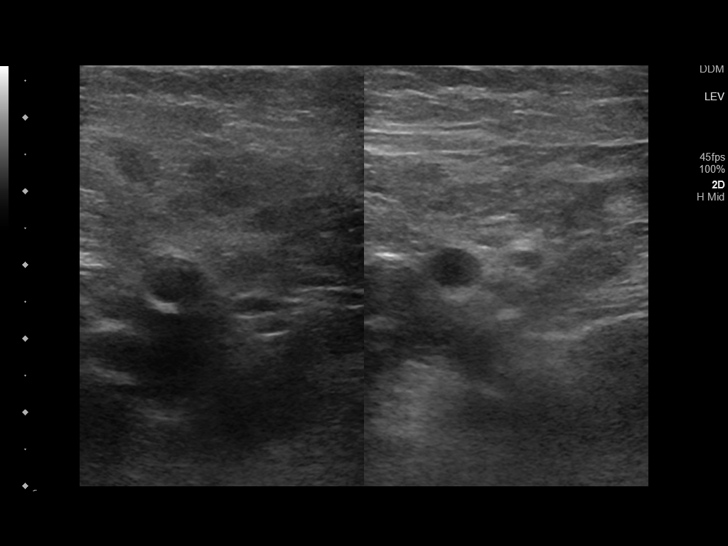
[im 36/60]
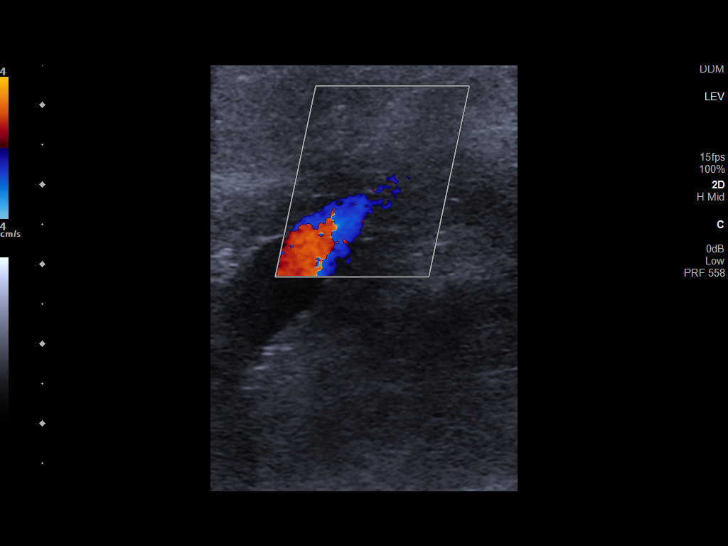
[im 42/60]
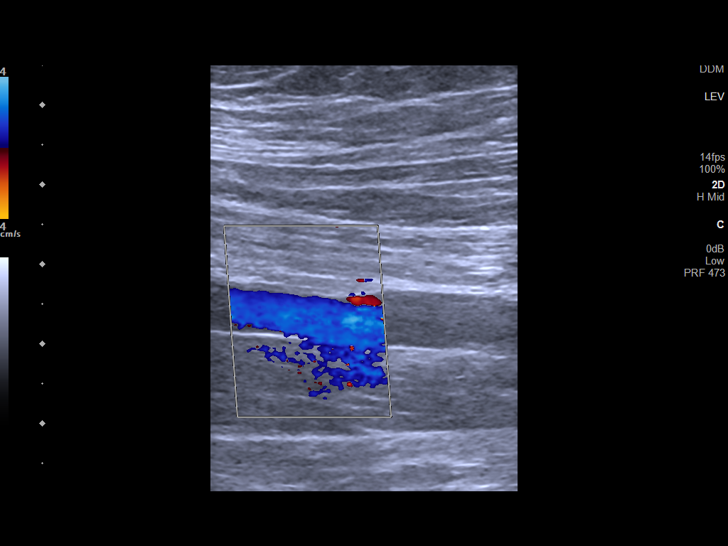
[im 47/60]
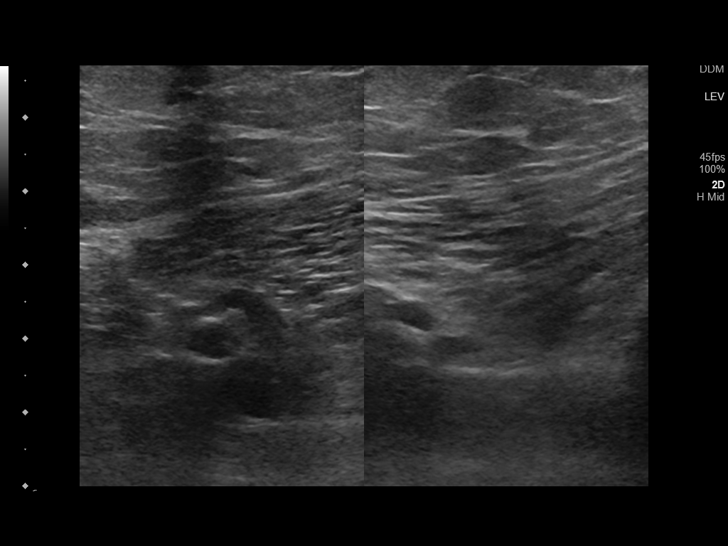
[im 49/60]
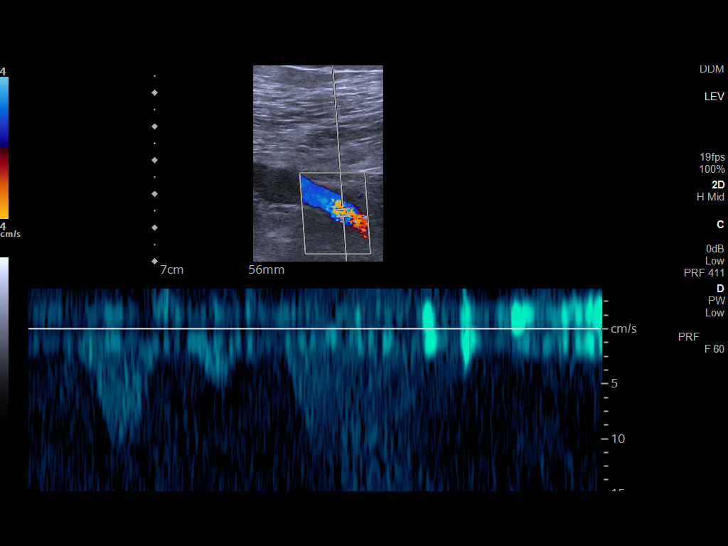
[im 54/60]
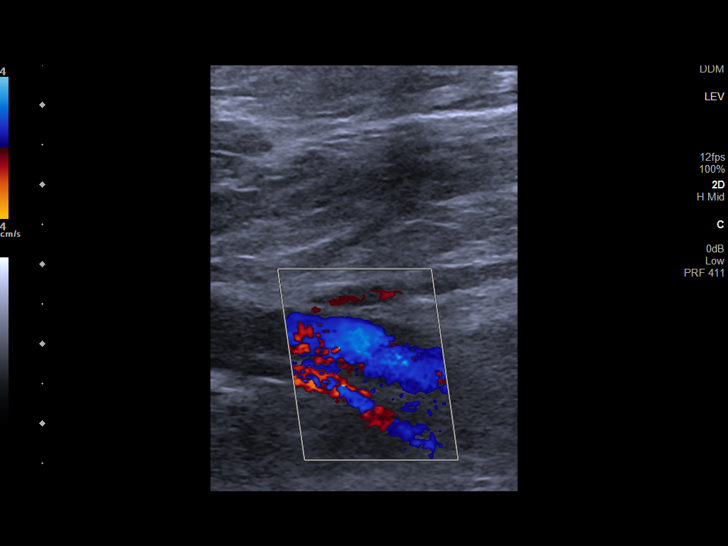
[im 60/60]
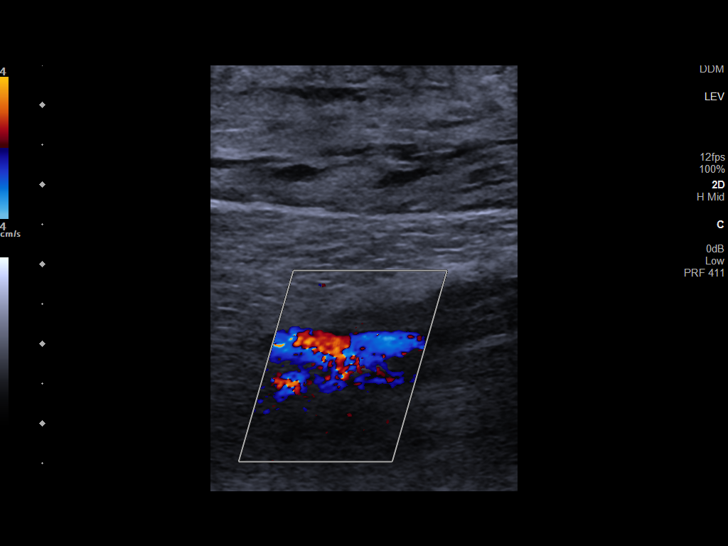

[14 of 24 positions shown; findings below may reference images not displayed]

FINDINGS: VENOUS

Normal compressibility of the common femoral, superficial femoral,
and popliteal veins, as well as the visualized calf veins.
Visualized portions of profunda femoral vein and great saphenous
vein unremarkable. No filling defects to suggest DVT on grayscale or
color Doppler imaging. Doppler waveforms show normal direction of
venous flow, normal respiratory plasticity and response to
augmentation.

Limited views of the contralateral common femoral vein are
unremarkable.

OTHER

None.

Limitations: none
IMPRESSION: Study is limited due to patient body habitus. Within visualized
limits, no DVT identified.

## 2024-06-26 DEATH — deceased
